# Patient Record
Sex: Female | Born: 1955 | Race: White | Hispanic: No | Marital: Married | Smoking: Never smoker
Health system: Southern US, Community
[De-identification: ages and names within clinical notes are randomized; demographics above are authoritative.]

## PROBLEM LIST (undated history)

## (undated) DIAGNOSIS — N200 Calculus of kidney: Secondary | ICD-10-CM

## (undated) DIAGNOSIS — N189 Chronic kidney disease, unspecified: Secondary | ICD-10-CM

## (undated) DIAGNOSIS — F0280 Dementia in other diseases classified elsewhere without behavioral disturbance: Secondary | ICD-10-CM

## (undated) DIAGNOSIS — F32A Depression, unspecified: Secondary | ICD-10-CM

## (undated) DIAGNOSIS — Z87442 Personal history of urinary calculi: Secondary | ICD-10-CM

## (undated) DIAGNOSIS — G300 Alzheimer's disease with early onset: Secondary | ICD-10-CM

## (undated) DIAGNOSIS — E039 Hypothyroidism, unspecified: Secondary | ICD-10-CM

## (undated) DIAGNOSIS — K219 Gastro-esophageal reflux disease without esophagitis: Secondary | ICD-10-CM

## (undated) DIAGNOSIS — F329 Major depressive disorder, single episode, unspecified: Secondary | ICD-10-CM

## (undated) DIAGNOSIS — E7800 Pure hypercholesterolemia, unspecified: Secondary | ICD-10-CM

## (undated) HISTORY — PX: CATARACT EXTRACTION: SUR2

## (undated) HISTORY — PX: LITHOTRIPSY: SUR834

## (undated) HISTORY — PX: CERVICAL POLYPECTOMY: SHX88

---

## 2012-01-01 ENCOUNTER — Encounter (HOSPITAL_COMMUNITY): Payer: Self-pay | Admitting: *Deleted

## 2012-01-01 ENCOUNTER — Other Ambulatory Visit: Payer: Self-pay | Admitting: Urology

## 2012-01-01 NOTE — Progress Notes (Signed)
Patient to come to short stay 01/02/12 for EKGTake laxative around 1700 day before procedure Do not wear a belt  Bring blue folder with papers filled out Bring insurance card    Pre Litho instructions given to patient and verbalized understanding:  Take  Laxative as directed No aspirin ,ibubrofen, advil toradol 72 hours prior to lithotripsy review list in blue folder

## 2012-01-02 ENCOUNTER — Ambulatory Visit (HOSPITAL_COMMUNITY)
Admission: RE | Admit: 2012-01-02 | Discharge: 2012-01-02 | Disposition: A | Discharge status: Home / Self Care | Payer: BC Managed Care – PPO | Source: Ambulatory Visit | Attending: Urology | Admitting: Urology

## 2012-01-02 DIAGNOSIS — Z01810 Encounter for preprocedural cardiovascular examination: Secondary | ICD-10-CM | POA: Insufficient documentation

## 2012-01-02 DIAGNOSIS — N201 Calculus of ureter: Secondary | ICD-10-CM | POA: Insufficient documentation

## 2012-01-06 ENCOUNTER — Encounter (HOSPITAL_COMMUNITY): Payer: Self-pay | Admitting: *Deleted

## 2012-01-06 ENCOUNTER — Ambulatory Visit (HOSPITAL_COMMUNITY)
Admission: RE | Admit: 2012-01-06 | Discharge: 2012-01-06 | Disposition: A | Discharge status: Home / Self Care | Payer: BC Managed Care – PPO | Source: Ambulatory Visit | Attending: Urology | Admitting: Urology

## 2012-01-06 ENCOUNTER — Encounter (HOSPITAL_COMMUNITY)
Admission: RE | Disposition: A | Discharge status: Home / Self Care | Payer: Self-pay | Source: Ambulatory Visit | Attending: Urology

## 2012-01-06 ENCOUNTER — Ambulatory Visit (HOSPITAL_COMMUNITY): Payer: BC Managed Care – PPO

## 2012-01-06 DIAGNOSIS — K219 Gastro-esophageal reflux disease without esophagitis: Secondary | ICD-10-CM | POA: Insufficient documentation

## 2012-01-06 DIAGNOSIS — N201 Calculus of ureter: Secondary | ICD-10-CM | POA: Insufficient documentation

## 2012-01-06 DIAGNOSIS — E039 Hypothyroidism, unspecified: Secondary | ICD-10-CM | POA: Insufficient documentation

## 2012-01-06 DIAGNOSIS — I129 Hypertensive chronic kidney disease with stage 1 through stage 4 chronic kidney disease, or unspecified chronic kidney disease: Secondary | ICD-10-CM | POA: Insufficient documentation

## 2012-01-06 DIAGNOSIS — Z79899 Other long term (current) drug therapy: Secondary | ICD-10-CM | POA: Insufficient documentation

## 2012-01-06 DIAGNOSIS — N189 Chronic kidney disease, unspecified: Secondary | ICD-10-CM | POA: Insufficient documentation

## 2012-01-06 HISTORY — DX: Chronic kidney disease, unspecified: N18.9

## 2012-01-06 HISTORY — DX: Depression, unspecified: F32.A

## 2012-01-06 HISTORY — DX: Major depressive disorder, single episode, unspecified: F32.9

## 2012-01-06 HISTORY — DX: Hypothyroidism, unspecified: E03.9

## 2012-01-06 HISTORY — DX: Calculus of kidney: N20.0

## 2012-01-06 HISTORY — DX: Gastro-esophageal reflux disease without esophagitis: K21.9

## 2012-01-06 SURGERY — LITHOTRIPSY, ESWL
Anesthesia: LOCAL | Laterality: Right

## 2012-01-06 MED ORDER — DIAZEPAM 5 MG PO TABS
10.0000 mg | ORAL_TABLET | ORAL | Status: AC
  Administered 2012-01-06: 10 mg via ORAL

## 2012-01-06 MED ORDER — CIPROFLOXACIN IN D5W 400 MG/200ML IV SOLN
400.0000 mg | INTRAVENOUS | Status: AC
  Administered 2012-01-06: 400 mg via INTRAVENOUS

## 2012-01-06 MED ORDER — DIPHENHYDRAMINE HCL 25 MG PO CAPS
25.0000 mg | ORAL_CAPSULE | ORAL | Status: AC
  Administered 2012-01-06: 25 mg via ORAL

## 2012-01-06 MED ORDER — DEXTROSE-NACL 5-0.45 % IV SOLN
INTRAVENOUS | Status: DC
  Administered 2012-01-06: 1000 mL via INTRAVENOUS

## 2012-01-06 NOTE — Progress Notes (Signed)
IV dc'd from right hand per protocol, cath intact and site unremarkable.

## 2012-01-06 NOTE — H&P (Signed)
H&P  Chief Complaint: kidney stone  History of Present Illness: Margaret Johnston is a 56 y.o. year old female who presents now for lithotripsy of a 6 mm right ureteropelvic junction stone.she originally presented to our office on 17 April with flank pain, nausea and vomiting. She had an evaluation which revealed a 6 mm proximal ureteral stone. A week later, there had been no progression on followup. She presents at this time for definitive management i.e. Lithotripsy.  Past Medical History  Diagnosis Date  . Angina     has had chest pain at times. Last time approx 3 mo ago  . Hypothyroidism   . Depression   . Chronic kidney disease     right ureteral stone  . GERD (gastroesophageal reflux disease)     rarely takes Prilosec  . Renal stones     Past Surgical History  Procedure Date  . Cervical polypectomy     Home Medications:  Medications Prior to Admission  Medication Sig Dispense Refill  . calcium carbonate (OS-CAL) 600 MG TABS Take 600 mg by mouth daily.      . fenofibrate 160 MG tablet Take 160 mg by mouth daily.      Marland Kitchen levothyroxine (SYNTHROID, LEVOTHROID) 100 MCG tablet Take 100 mcg by mouth daily.      . mometasone (NASONEX) 50 MCG/ACT nasal spray Place 2 sprays into the nose daily.      . silodosin (RAPAFLO) 8 MG CAPS capsule Take 8 mg by mouth daily with breakfast.        Allergies:  Allergies  Allergen Reactions  . Oxycodone Itching    History reviewed. No pertinent family history.  Social History:  reports that she has never smoked. She does not have any smokeless tobacco history on file. She reports that she drinks alcohol. She reports that she does not use illicit drugs. Review of Systems Genitourinary, constitutional, skin, eye, otolaryngeal, hematologic/lymphatic, cardiovascular, pulmonary, endocrine, musculoskeletal, gastrointestinal, neurological and psychiatric system(s) were reviewed and pertinent findings if present are noted.  Gastrointestinal: flank  pain and abdominal pain.     Physical Exam:  Vital signs in last 24 hours: Temp:  [98.5 F (36.9 C)] 98.5 F (36.9 C) (04/29 1539) Pulse Rate:  [65] 65  (04/29 1539) Resp:  [16] 16  (04/29 1539) BP: (118)/(78) 118/78 mmHg (04/29 1539) SpO2:  [100 %] 100 % (04/29 1539) Weight:  [69.911 kg (154 lb 2 oz)] 69.911 kg (154 lb 2 oz) (04/29 1539) General:  Alert and oriented, No acute distress HEENT: Normocephalic, atraumatic Neck: No JVD or lymphadenopathy Cardiovascular: Regular rate and rhythm Lungs: Clear bilaterally Abdomen: Soft, nontender, nondistended, no abdominal masses Back: No CVA tenderness Extremities: No edema Neurologic: Grossly intact  Laboratory Data:  No results found for this or any previous visit (from the past 24 hour(s)). No results found for this or any previous visit (from the past 240 hour(s)). Creatinine: No results found for this basename: CREATININE:7 in the last 168 hours  Radiologic Imaging: Dg Abd 1 View  01/06/2012  *RADIOLOGY REPORT*  Clinical Data: Lithotripsy  ABDOMEN - 1 VIEW  Comparison: 12/31/2011  Findings: 6 mm calculus projects over  the right ureteropelvic junction.  5 mm calculus projects over the upper pole of the right kidney.  Vascular calcifications are noted.  Left pelvic phleboliths.  No disproportionate dilatation of bowel.  IMPRESSION: Right nephrolithiasis.  6 mm right ureteral pelvic junction calculus.  Nonobstructive bowel gas pattern.  Original Report Authenticated By: Marlowe Aschoff  HOSS, M.D.    Impression/Assessment:  6 mm right proximal ureteral stone  Plan:  lithotripsy  Chelsea Aus 01/06/2012, 5:26 PM  Bertram Millard. Lizanne Erker MD

## 2012-01-06 NOTE — Discharge Instructions (Signed)
See [facility] discharge instructions in chart.  

## 2012-04-07 ENCOUNTER — Other Ambulatory Visit: Payer: Self-pay | Admitting: Urology

## 2012-04-13 ENCOUNTER — Encounter (HOSPITAL_COMMUNITY): Payer: Self-pay | Admitting: *Deleted

## 2012-04-13 NOTE — H&P (Signed)
  H&P  Chief Complaint:kidneys  History of Present Illness: Margaret Johnston is a 56 y.o. year old female who has a prior history of urolithiasis, and recently presented with 29th of July with right flank pain. CT urogram revealed a 6-7 mm right upper/mid ureteral calculus. Because of her history of non-passage of stones, she is scheduled for lithotripsy.  Past Medical History  Diagnosis Date  . Angina     has had chest pain at times. Last time approx 3 mo ago  . Hypothyroidism   . Depression   . Chronic kidney disease     right ureteral stone  . GERD (gastroesophageal reflux disease)     rarely takes Prilosec  . Renal stones     Past Surgical History  Procedure Date  . Cervical polypectomy     Home Medications:  No prescriptions prior to admission    Allergies:  Allergies  Allergen Reactions  . Oxycodone Itching    No family history on file.  Social History:  reports that she has never smoked. She does not have any smokeless tobacco history on file. She reports that she drinks alcohol. She reports that she does not use illicit drugs.  ROS: A complete review of systems was performed.  All systems are negative except for pertinent findings as noted.  Physical Exam:  Vital signs in last 24 hours:   General:  Alert and oriented, No acute distress HEENT: Normocephalic, atraumatic Neck: No JVD or lymphadenopathy Cardiovascular: Regular rate and rhythm Lungs: Clear bilaterally Abdomen: Soft, nontender, nondistended, no abdominal masses Back: No CVA tenderness Extremities: No edema Neurologic: Grossly intact  Laboratory Data:  No results found for this or any previous visit (from the past 24 hour(s)). No results found for this or any previous visit (from the past 240 hour(s)). Creatinine: No results found for this basename: CREATININE:7 in the last 168 hours  Radiologic Imaging: No results found.  Impression/Assessment:  7 mm right upper ureteral stone  Plan:   Lithotripsy of right ureteral stone  Chelsea Aus 04/13/2012, 9:56 AM  Bertram Millard. Clare Fennimore MD

## 2012-04-13 NOTE — Progress Notes (Signed)
1255 Asked to bring blue folder, take laxative and light dinner 04-15-12 by 5 pm and no later than 6 pm Bring blue folder,insurance and ID, Asked not to take any Aspirin,Ibuprofen or Toradol 72 hours prior to procedure. Patient was able to repeat all pre op instructions back without difficiuilty for her lithotripsy procedure.

## 2012-04-15 ENCOUNTER — Encounter (HOSPITAL_COMMUNITY): Payer: Self-pay | Admitting: Pharmacy Technician

## 2012-04-16 ENCOUNTER — Encounter (HOSPITAL_COMMUNITY): Admission: RE | Payer: Self-pay | Source: Ambulatory Visit

## 2012-04-16 ENCOUNTER — Ambulatory Visit (HOSPITAL_COMMUNITY): Admission: RE | Admit: 2012-04-16 | Payer: BC Managed Care – PPO | Source: Ambulatory Visit | Admitting: Urology

## 2012-04-16 SURGERY — LITHOTRIPSY, ESWL
Anesthesia: LOCAL | Laterality: Right

## 2012-07-31 ENCOUNTER — Other Ambulatory Visit: Payer: Self-pay | Admitting: Urology

## 2012-08-10 ENCOUNTER — Encounter (HOSPITAL_COMMUNITY): Payer: Self-pay | Admitting: *Deleted

## 2012-08-10 ENCOUNTER — Encounter (HOSPITAL_COMMUNITY): Payer: Self-pay | Admitting: Pharmacy Technician

## 2012-08-10 NOTE — Pre-Procedure Instructions (Addendum)
Asked to bring blue folder the day of the procedure,insurance card,I.D. driver's license,wear comfortable clothing and have a driver for the day. Asked not to take Advil,Motrin,Ibuprofen,Aleve or any NSAIDS, Aspirin, or Toradol for 72 hours prior to procedure,  No vitamins or herbal medications 7 days prior to procedure.  Stopping vitamin and calium. Instructed to take laxative per doctor's office instructions and eat a light dinner the evening before procedure.   To arrive at 0645 for lithotripsy procedure.  History of angina denies having chest discomfort or pain.

## 2012-08-24 ENCOUNTER — Encounter (HOSPITAL_COMMUNITY): Payer: Self-pay | Admitting: *Deleted

## 2012-08-24 ENCOUNTER — Encounter (HOSPITAL_COMMUNITY)
Admission: RE | Disposition: A | Discharge status: Home / Self Care | Payer: Self-pay | Source: Ambulatory Visit | Attending: Urology

## 2012-08-24 ENCOUNTER — Ambulatory Visit (HOSPITAL_COMMUNITY): Payer: BC Managed Care – PPO

## 2012-08-24 ENCOUNTER — Ambulatory Visit (HOSPITAL_COMMUNITY)
Admission: RE | Admit: 2012-08-24 | Discharge: 2012-08-24 | Disposition: A | Discharge status: Home / Self Care | Payer: BC Managed Care – PPO | Source: Ambulatory Visit | Attending: Urology | Admitting: Urology

## 2012-08-24 DIAGNOSIS — N201 Calculus of ureter: Secondary | ICD-10-CM

## 2012-08-24 DIAGNOSIS — K219 Gastro-esophageal reflux disease without esophagitis: Secondary | ICD-10-CM | POA: Insufficient documentation

## 2012-08-24 DIAGNOSIS — Z79899 Other long term (current) drug therapy: Secondary | ICD-10-CM | POA: Insufficient documentation

## 2012-08-24 DIAGNOSIS — E7800 Pure hypercholesterolemia, unspecified: Secondary | ICD-10-CM | POA: Insufficient documentation

## 2012-08-24 DIAGNOSIS — E039 Hypothyroidism, unspecified: Secondary | ICD-10-CM | POA: Insufficient documentation

## 2012-08-24 HISTORY — DX: Pure hypercholesterolemia, unspecified: E78.00

## 2012-08-24 SURGERY — LITHOTRIPSY, ESWL
Anesthesia: LOCAL | Laterality: Right

## 2012-08-24 MED ORDER — LEVOFLOXACIN 500 MG PO TABS
500.0000 mg | ORAL_TABLET | ORAL | Status: AC
  Administered 2012-08-24: 500 mg via ORAL
  Filled 2012-08-24: qty 1

## 2012-08-24 MED ORDER — DEXTROSE-NACL 5-0.45 % IV SOLN
INTRAVENOUS | Status: DC
  Administered 2012-08-24: 08:00:00 via INTRAVENOUS

## 2012-08-24 MED ORDER — KETOROLAC TROMETHAMINE 30 MG/ML IJ SOLN
30.0000 mg | Freq: Once | INTRAMUSCULAR | Status: AC
  Administered 2012-08-24: 30 mg via INTRAVENOUS
  Filled 2012-08-24: qty 1

## 2012-08-24 MED ORDER — DIPHENHYDRAMINE HCL 25 MG PO CAPS
25.0000 mg | ORAL_CAPSULE | ORAL | Status: AC
  Administered 2012-08-24: 25 mg via ORAL
  Filled 2012-08-24: qty 1

## 2012-08-24 MED ORDER — DIAZEPAM 5 MG PO TABS
10.0000 mg | ORAL_TABLET | ORAL | Status: AC
  Administered 2012-08-24: 10 mg via ORAL
  Filled 2012-08-24: qty 2

## 2012-08-24 NOTE — H&P (Signed)
  H&P  Chief Complaint: Right sided kidney stone  History of Present Illness: Margaret Johnston is a 56 y.o. year old who presents for lithotripsy of a symptomatic 6 mm right proximal/mid ureteral stone.   Past Medical History  Diagnosis Date  . Angina     has had chest pain at times. Last time approx 3 mo ago  . Hypothyroidism   . Chronic kidney disease     right ureteral stone  . GERD (gastroesophageal reflux disease)     rarely takes Prilosec  . Renal stones   . Depression   . Hypercholesterolemia     Past Surgical History  Procedure Date  . Cervical polypectomy     Home Medications:  No prescriptions prior to admission    Allergies:  Allergies  Allergen Reactions  . Hydromorphone Itching  . Oxycodone Itching    History reviewed. No pertinent family history.  Social History:  reports that she has never smoked. She has never used smokeless tobacco. She reports that she drinks alcohol. She reports that she does not use illicit drugs.  ROS: A complete review of systems was performed.  All systems are negative except for pertinent findings as noted.  Physical Exam:  Vital signs in last 24 hours:   General:  Alert and oriented, No acute distress HEENT: Normocephalic, atraumatic Neck: No JVD or lymphadenopathy Cardiovascular: Regular rate and rhythm Lungs: Clear bilaterally Abdomen: Soft, nontender, nondistended, no abdominal masses Back: No CVA tenderness Extremities: No edema Neurologic: Grossly intact  Laboratory Data:  No results found for this or any previous visit (from the past 24 hour(s)). No results found for this or any previous visit (from the past 240 hour(s)). Creatinine: No results found for this basename: CREATININE:7 in the last 168 hours  Radiologic Imaging: No results found.  Impression/Assessment:  6 mm right proximal ureteral ston  Plan:  ESL of right ureteral stone  Chelsea Aus 08/24/2012, 6:03 AM  Bertram Millard. Quamel Fitzmaurice  MD

## 2012-08-24 NOTE — Interval H&P Note (Signed)
History and Physical Interval Note:  08/24/2012 8:48 AM  [name]  has presented today for surgery, with the diagnosis of RIGHT URETERAL STONE  The various methods of treatment have been discussed with the patient and family. After consideration of risks, benefits and other options for treatment, the patient has consented to  Procedure(s) (LRB) with comments: EXTRACORPOREAL SHOCK WAVE LITHOTRIPSY (ESWL) (Right) as a surgical intervention .  The patient's history has been reviewed, patient examined, no change in status, stable for surgery.  I have reviewed the patient's chart and labs.  Questions were answered to the patient's satisfaction.     Margaret Johnston

## 2012-08-24 NOTE — Progress Notes (Signed)
Patient feels much better after Toradol. Has voided x2. Home with husband.

## 2012-08-24 NOTE — Progress Notes (Signed)
Patient has been back from [facility] for almost two hours. Waking up now and has been to the bathroom and voided. Stated her pain is 4-5/10 in her right lower flank. Paged MD for pain med order. Patient stated she has pain med script at home.

## 2012-08-24 NOTE — Progress Notes (Signed)
Patient c/o a little nausea. Instructed to slow down on po fluids. Patient is so sleepy. Do not want to give her further meds , if can keep her comfortable w/o.

## 2012-08-24 NOTE — Progress Notes (Signed)
Spoke to Dr. Retta Diones and he is entering order for pain med now.

## 2015-01-18 ENCOUNTER — Other Ambulatory Visit: Payer: Self-pay | Admitting: Family Medicine

## 2015-01-18 DIAGNOSIS — R110 Nausea: Secondary | ICD-10-CM

## 2015-01-18 DIAGNOSIS — R1011 Right upper quadrant pain: Secondary | ICD-10-CM

## 2015-02-01 ENCOUNTER — Ambulatory Visit
Admission: RE | Admit: 2015-02-01 | Discharge: 2015-02-01 | Disposition: A | Discharge status: Home / Self Care | Payer: Self-pay | Source: Ambulatory Visit | Attending: Family Medicine | Admitting: Family Medicine

## 2015-02-01 DIAGNOSIS — R1011 Right upper quadrant pain: Secondary | ICD-10-CM

## 2015-02-01 DIAGNOSIS — R110 Nausea: Secondary | ICD-10-CM

## 2015-11-10 ENCOUNTER — Other Ambulatory Visit: Payer: Self-pay | Admitting: Family Medicine

## 2015-11-10 DIAGNOSIS — R413 Other amnesia: Secondary | ICD-10-CM

## 2015-11-16 ENCOUNTER — Ambulatory Visit
Admission: RE | Admit: 2015-11-16 | Discharge: 2015-11-16 | Disposition: A | Discharge status: Home / Self Care | Payer: BLUE CROSS/BLUE SHIELD | Source: Ambulatory Visit | Attending: Family Medicine | Admitting: Family Medicine

## 2015-11-16 DIAGNOSIS — R413 Other amnesia: Secondary | ICD-10-CM

## 2015-12-07 ENCOUNTER — Ambulatory Visit (INDEPENDENT_AMBULATORY_CARE_PROVIDER_SITE_OTHER): Payer: BLUE CROSS/BLUE SHIELD | Admitting: Neurology

## 2015-12-07 ENCOUNTER — Encounter: Payer: Self-pay | Admitting: Neurology

## 2015-12-07 ENCOUNTER — Other Ambulatory Visit: Payer: BLUE CROSS/BLUE SHIELD

## 2015-12-07 VITALS — BP 100/70 | HR 75 | Ht 66.0 in | Wt 153.2 lb

## 2015-12-07 DIAGNOSIS — H02401 Unspecified ptosis of right eyelid: Secondary | ICD-10-CM | POA: Diagnosis not present

## 2015-12-07 DIAGNOSIS — G3184 Mild cognitive impairment, so stated: Secondary | ICD-10-CM

## 2015-12-07 LAB — VITAMIN B12: Vitamin B-12: 510 pg/mL (ref 211–911)

## 2015-12-07 NOTE — Progress Notes (Signed)
[facility] Clinic Note - Initial Visit   Date: 12/07/2015  Marja Adderley MRN: [id number] DOB: Jun 15, 1956   Dear Dr. Duanne Guess:  Thank you for your kind referral of Raneen Jaffer for consultation of memory changes. Although her history is well known to you, please allow [place] to reiterate it for the purpose of our medical record. The patient was accompanied to the clinic by husband who also provides collateral information.     History of Present Illness: Saory Carriero is a 60 y.o. right-handed Caucasian female with hypothyroid, GERD, and history of kidney stones presenting for evaluation of memory changes.    Her husband retired in August 2015 because of newly diagnosed neuroendocrine pancreatic cancer.  Around this time, she began noticing that that she could not always remember the date.  She had been managing finances for a number of years but in 2016, she delegated the responsibilities to her husband because of difficulty with numbers.  She would often make errors such as saying "100" instead of "1000".  She has restricted her driving to local distances only.  She has not been involved in any MVAs.  She has interests such as sewing and crochet and volunteers for the church.  Sleep is good.  Mood is good. Her husband states that there is some anxiety because of his medical problems since he is being monitored every 3 months for recurrence. She does all the cleaning and cooking at home. She has seven grandchildren, all in [place], except for one in [place] so they travel often.   Dementia questions Memory Are you repeating things excessively?  No Are you forgetting important details of conversations/events? No Are you prone to misplacing items more now than in the past? Yes, she may walk out of a restaurant without her purse.  Today, in our lobby, she left her purse.  Can you tell me about some recent headlines?  Garnet Koyanagi    Executive Are you having trouble  managing financial matters?  Some  Are you taking your medications regularly w/o prompting? Yes Can you organize and prepare a large holiday meal for multiple people?  I think I could do it Can you multitask effectively?  I don't know, husband states that she can  Language Do you have any word finding difficulties?  Sometimes Do you have trouble following instructions or a conversation? If written Have you been avoiding reading or writing due to problems with recognition or remembering words?  No  Are you using generalities when speaking because of memory trouble? No  Visuospatial Are you getting lost while driving? No  Are you getting turned around in your own home?  No Do you have trouble recognizing familiar faces/family members/close friends? No Do you have trouble dressing, putting on cloths? No     Out-side paper records, electronic medical record, and images have been reviewed where available and summarized as:  CT head 11/16/2015:  Normal   Past Medical History  Diagnosis Date  . Angina     has had chest pain at times. Last time approx 3 mo ago  . Hypothyroidism   . Chronic kidney disease     right ureteral stone  . GERD (gastroesophageal reflux disease)     rarely takes Prilosec  . Renal stones   . Depression   . Hypercholesterolemia     Past Surgical History  Procedure Laterality Date  . Cervical polypectomy       Medications:  Outpatient Encounter Prescriptions  as of 12/07/2015  Medication Sig Note  . calcium carbonate (OS-CAL) 600 MG TABS Take 600 mg by mouth daily.   . cetirizine (ZYRTEC) 10 MG tablet Take 10 mg by mouth daily as needed. allergies   . Fenofibrate 40 MG TABS Take by mouth.   Marland Kitchen ibuprofen (ADVIL,MOTRIN) 800 MG tablet  12/07/2015: Received from: External Pharmacy  . levothyroxine (SYNTHROID, LEVOTHROID) 100 MCG tablet Take 100 mcg by mouth daily before breakfast.    . Multiple Vitamin (MULTIVITAMIN WITH MINERALS) TABS Take 1 tablet by mouth  daily.   Marland Kitchen omeprazole (PRILOSEC) 40 MG capsule  12/07/2015: Received from: External Pharmacy  . Tamsulosin HCl (FLOMAX) 0.4 MG CAPS Take 0.4 mg by mouth daily.   . Vitamin D, Ergocalciferol, (DRISDOL) 50000 units CAPS capsule  12/07/2015: Received from: External Pharmacy  . [DISCONTINUED] atorvastatin (LIPITOR) 40 MG tablet Take 40 mg by mouth every evening.    No facility-administered encounter medications on file as of 12/07/2015.     Allergies:  Allergies  Allergen Reactions  . Hydromorphone Itching  . Oxycodone Itching    Family History: Family History  Problem Relation Age of Onset  . Heart disease Father   . Kidney disease Father   . Cancer Maternal Grandmother   . Alzheimer's disease Paternal Grandmother     Social History: Social History  Substance Use Topics  . Smoking status: Never Smoker   . Smokeless tobacco: Never Used  . Alcohol Use: 0.0 oz/week    0 Standard drinks or equivalent per week     Comment: rarely   Social History   Social History Narrative   Lives with husband in a 2 story home.  Retired Runner, broadcasting/film/video.  Has 3 children.  Education: college.    Review of Systems:  CONSTITUTIONAL: No fevers, chills, night sweats, or weight loss.   EYES: No visual changes or eye pain ENT: No hearing changes.  No history of nose bleeds.   RESPIRATORY: No cough, wheezing and shortness of breath.   CARDIOVASCULAR: Negative for chest pain, and palpitations.   GI: Negative for abdominal discomfort, blood in stools or black stools.  No recent change in bowel habits.   GU:  No history of incontinence.   MUSCLOSKELETAL: No history of joint pain or swelling.  No myalgias.   SKIN: Negative for lesions, rash, and itching.   HEMATOLOGY/ONCOLOGY: Negative for prolonged bleeding, bruising easily, and swollen nodes.  No history of cancer.   ENDOCRINE: Negative for cold or heat intolerance, polydipsia or goiter.   PSYCH:  No depression or anxiety symptoms.   NEURO: As  Above.   Vital Signs:  BP 100/70 mmHg  Pulse 75  Ht  (1.676 m)  Wt 153 lb 3 oz (69.485 kg)  BMI 24.74 kg/m2  SpO2 98% Pain Scale: 0 on a scale of 0-10   General Medical Exam:   General:  Blunted affect, comfortable.   Eyes/ENT: see cranial nerve examination.   Neck: No masses appreciated.  Full range of motion without tenderness.  No carotid bruits. Respiratory:  Clear to auscultation, good air entry bilaterally.   Cardiac:  Regular rate and rhythm, no murmur.   Extremities:  No deformities, edema, or skin discoloration.  Skin:  No rashes or lesions.  Neurological Exam: MENTAL STATUS including orientation to time, place, person is intact.  Attention and concentration is slightly impaired.  Speech is not dysarthric.  Montreal Cognitive Assessment  12/07/2015  Visuospatial/ Executive (0/5) 3  Naming (0/3) 3  Attention:  Read list of digits (0/2) 1  Attention: Read list of letters (0/1) 1  Attention: Serial 7 subtraction starting at 100 (0/3) 2  Language: Repeat phrase (0/2) 2  Language : Fluency (0/1) 1  Abstraction (0/2) 2  Delayed Recall (0/5) 0  Orientation (0/6) 5  Total 20  Adjusted Score (based on education) 20   Luria test positive bilaterally No evidence of apraxia  CRANIAL NERVES: II:  No visual field defects.  Unremarkable fundi.   III-IV-VI: Pupils equal round and reactive to light.  Normal conjugate, extra-ocular eye movements in all directions of gaze.  No nystagmus.  Right ptosis (old).   V:  Normal facial sensation.  Jaw jerk is absent.   VII:  Normal facial symmetry and movements.  No snout or palmomental reflexes.  Myerson's sign is positive.   VIII:  Normal hearing and vestibular function.   IX-X:  Normal palatal movement.   XI:  Normal shoulder shrug and head rotation.   XII:  Normal tongue strength and range of motion, no deviation or fasciculation.  MOTOR:  No atrophy, fasciculations or abnormal movements.  No pronator drift.  Tone is normal.     Right Upper Extremity:    Left Upper Extremity:    Deltoid  5/5   Deltoid  5/5   Biceps  5/5   Biceps  5/5   Triceps  5/5   Triceps  5/5   Wrist extensors  5/5   Wrist extensors  5/5   Wrist flexors  5/5   Wrist flexors  5/5   Finger extensors  5/5   Finger extensors  5/5   Finger flexors  5/5   Finger flexors  5/5   Dorsal interossei  5/5   Dorsal interossei  5/5   Abductor pollicis  5/5   Abductor pollicis  5/5   Tone (Ashworth scale)  0  Tone (Ashworth scale)  0   Right Lower Extremity:    Left Lower Extremity:    Hip flexors  5/5   Hip flexors  5/5   Hip extensors  5/5   Hip extensors  5/5   Knee flexors  5/5   Knee flexors  5/5   Knee extensors  5/5   Knee extensors  5/5   Dorsiflexors  5/5   Dorsiflexors  5/5   Plantarflexors  5/5   Plantarflexors  5/5   Toe extensors  5/5   Toe extensors  5/5   Toe flexors  5/5   Toe flexors  5/5   Tone (Ashworth scale)  0  Tone (Ashworth scale)  0   MSRs:  Right                                                                 Left brachioradialis 2+  brachioradialis 2+  biceps 2+  biceps 2+  triceps 2+  triceps 2+  patellar 2+  patellar 2+  ankle jerk 2+  ankle jerk 2+  Hoffman no  Hoffman no  plantar response down  plantar response down   SENSORY:  Normal and symmetric perception of light touch, pinprick, vibration, and proprioception.  Romberg's sign absent.   COORDINATION/GAIT: Normal finger-to- nose-finger and heel-to-shin.  Intact rapid alternating movements bilaterally.  Able to rise from a chair  without using arms.  Gait narrow based and stable. Tandem and stressed gait intact.    IMPRESSION: Mrs. Heberlein is a 60 year-old female referred for evaluation of short-term memory loss.  Her cognitive testing shows deficits involving visuospatial, attention, and recall.  The remainder of her neurological exam shows right ptosis and otherwise is normal.  To better characterize the nature of her cognitive dysfunction, MRI brain and  neuropsychological testing will be ordered.  We discussed possible diagnosis of mood disorder vs. early onset dementia syndrome.    PLAN/RECOMMENDATIONS:  1.  MRI brain without contrast 2.  Check vitamin B12, vitamin E 3.  Formal neuropsychiatry testing 4.  Encouraged to start exercise program, engage in mentally stimulating activities such as puzzles, crosswords  Return to clinic in 3 months.   The duration of this appointment visit was 60 minutes of face-to-face time with the patient.  Greater than 50% of this time was spent in counseling, explanation of diagnosis, planning of further management, and coordination of care.   Thank you for allowing me to participate in patient's care.  If I can answer any additional questions, I would be pleased to do so.    Sincerely,    Joice Nazario K. Allena Katz, DO

## 2015-12-07 NOTE — Patient Instructions (Addendum)
1.  MRI brain without contrast 2.  Check blood work 3.  Formal neuropsychiatry testing 4.  Encouraged to start exercise program, engage in mentally stimulating activities such as puzzles, crosswords 4.  Return to clinic after the above testing

## 2015-12-08 NOTE — Progress Notes (Signed)
Note routed

## 2015-12-12 ENCOUNTER — Ambulatory Visit
Admission: RE | Admit: 2015-12-12 | Discharge: 2015-12-12 | Disposition: A | Discharge status: Home / Self Care | Payer: BLUE CROSS/BLUE SHIELD | Source: Ambulatory Visit | Attending: Urology | Admitting: Urology

## 2015-12-12 DIAGNOSIS — R410 Disorientation, unspecified: Secondary | ICD-10-CM | POA: Diagnosis not present

## 2015-12-12 DIAGNOSIS — H02401 Unspecified ptosis of right eyelid: Secondary | ICD-10-CM

## 2015-12-12 DIAGNOSIS — G3184 Mild cognitive impairment, so stated: Secondary | ICD-10-CM

## 2015-12-12 LAB — VITAMIN E: VITAMIN E (ALPHA TOCOPHEROL): 16.7 mg/L (ref 6.5–21.5)

## 2015-12-15 ENCOUNTER — Telehealth: Payer: Self-pay | Admitting: Neurology

## 2015-12-15 NOTE — Telephone Encounter (Signed)
Copy mailed to patient and referral sent.

## 2015-12-15 NOTE — Telephone Encounter (Signed)
Called patient with results of MRI brian which shows mild global atrophy, worse over the parietal lobes.  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  She endorses right ear and jaw pain.  To better characterize the nature of her parotid abnormality, I will send her to ENT for evaluation.  Margaret Fomby K. [name], DO

## 2015-12-20 ENCOUNTER — Encounter: Payer: Self-pay | Admitting: Neurology

## 2015-12-26 DIAGNOSIS — E039 Hypothyroidism, unspecified: Secondary | ICD-10-CM | POA: Diagnosis not present

## 2015-12-28 DIAGNOSIS — J309 Allergic rhinitis, unspecified: Secondary | ICD-10-CM | POA: Diagnosis not present

## 2015-12-28 DIAGNOSIS — R413 Other amnesia: Secondary | ICD-10-CM | POA: Diagnosis not present

## 2015-12-28 DIAGNOSIS — Z6825 Body mass index (BMI) 25.0-25.9, adult: Secondary | ICD-10-CM | POA: Diagnosis not present

## 2015-12-28 DIAGNOSIS — E785 Hyperlipidemia, unspecified: Secondary | ICD-10-CM | POA: Diagnosis not present

## 2015-12-29 ENCOUNTER — Telehealth: Payer: Self-pay | Admitting: *Deleted

## 2015-12-29 NOTE — Telephone Encounter (Signed)
[facility] with Dr. Jearld FentonByers.

## 2016-01-10 DIAGNOSIS — D110 Benign neoplasm of parotid gland: Secondary | ICD-10-CM | POA: Diagnosis not present

## 2016-01-23 ENCOUNTER — Ambulatory Visit (INDEPENDENT_AMBULATORY_CARE_PROVIDER_SITE_OTHER): Payer: BLUE CROSS/BLUE SHIELD | Admitting: Psychology

## 2016-01-23 DIAGNOSIS — R4189 Other symptoms and signs involving cognitive functions and awareness: Secondary | ICD-10-CM | POA: Diagnosis not present

## 2016-01-23 DIAGNOSIS — R413 Other amnesia: Secondary | ICD-10-CM | POA: Diagnosis not present

## 2016-01-24 ENCOUNTER — Encounter: Payer: Self-pay | Admitting: Psychology

## 2016-01-24 NOTE — Progress Notes (Signed)
NEUROPSYCHOLOGICAL INTERVIEW (CPT: [id number])  Name: Margaret Johnston Date of Birth: 06-24-1956 Date of Interview: 01/24/2016  Reason for Referral:  Margaret Johnston is a 60 y.o., right-handed, married female who is referred for neuropsychological evaluation by Dr. Nita Sickle of [facility] due to concerns about memory changes. This patient was unaccompanied to the appointment today.  History of Presenting Problem:  Margaret Johnston reported first noticing mild cognitive changes in 2015. She reported that she initially noticed having trouble with numbers. She noticed more difficulty in filling out deposit slips and in writing or saying numbers. In July 2015, the patient's husband was diagnosed with neuroendocrine pancreatic cancer. He retired in August 2015. She reported that she noticed the aforementioned cognitive changes prior to his diagnosis and retirement. In 2016, she delegated the finances to her husband because of the difficulties she was having. She also reported that over time she has become much less comfortable driving. She reported having some confusion with stoplights and lanes. She reported it is difficult to explain the exact problems she is having with this. Other cognitive changes reported by the patient include frequently misplacing belongings such as her glasses and her purse. Since seeing Dr. Allena Katz in March, the patient has been wearing a cross body satchel type purse so that she does not lose it. Of note, she wore the purse on her person throughout the entire three-hour appointment today. The patient also reported that she has trouble remembering the day and date, and has to ask her husband for this information frequently. Also, the patient described one occasion somewhat recently when she was paying the bills at a restaurant and became very overwhelmed and had difficulty processing what to do. She reported that has only happened on one occasion. Overall, the patient reported only mild  concern about her cognitive changes. She noted that if she had not been asked directly about changes in memory at her routine appointment, she would not have brought it up to anyone.  Margaret Johnston was seen for neurologic consultation by Dr. Allena Katz on 12/07/2015. Her score on the MOCA was a 20. Neurologic exam was negative aside from right ptosis. An MRI of the brain was ordered. According to the report dated 12/12/2015, there was mild global atrophy most notable in the parietal lobes.  The patient denied any significant physical concerns or physical pain at the current time. She did note intermittent burning sensation in her feet, which she has mentioned to her primary care physician.  When asked about her mood, Margaret Johnston reported that it was "average, not terrible, not terrific". She denied difficulties with sleep or appetite. She noted that her husband's terminal illness is probably always in the back of her mind, but that she is not sad all the time as a result of this. She and her husband continue to have good times together. Unfortunately, she reported that he did recently undergo some testing which showed increased problems with his lungs, which were affected by a previous bout of cancer.  Margaret Johnston also reported chronic psychosocial stress related to her relationship with her mother who has borderline personality disorder. She has been essentially estranged from her mother for 8-9 years. She reported that Mother's Day, which was 2 days ago, is always difficult for her.  The patient denied any psychiatric history aside from situational depression related to relationship tension with her mother. She noted that after one conflict with her mother several years ago, she did experience some passive suicidal  ideation but no intention or plan. She denied any suicidal ideation or intention since that time. She did see a counselor for about a year in the past to deal with stress surrounding her relationship with her  mother.  Current Functioning: Margaret Johnston lives with her husband in their own home. She is retired. Her husband is continuing to undergo treatment for his stage IV neuroendocrine cancer. She reported that he has been relatively stable for about 2 years. The patient drives locally. As noted previously, she is more uncomfortable with driving recently. She denied any history of getting lost. She denied history of MVA. The patient continues to manage her medications and do housekeeping tasks without any significant reported difficulty. As noted previously she has reduced her involvement in the management of finances and bill paying due to "difficulty with numbers" and difficulty completing forms such as deposit slips.   Social History: Margaret Johnston was born in [place] and moved frequently throughout her childhood due to her father's job. She denied history of learning difficulties. She graduated from high school and completed a bachelor's degree. She worked as an Occupational psychologistLD teacher. Margaret Johnston has been married for 39 years. She and her husband have 3 children and 7 grandchildren. The patient reported minimal/rare alcohol use. She has never been a smoker or tobacco user. She denied history of substance abuse or dependence.   Medical History: Past Medical History  Diagnosis Date  . Angina     has had chest pain at times. Last time approx 3 mo ago  . Hypothyroidism   . Chronic kidney disease     right ureteral stone  . GERD (gastroesophageal reflux disease)     rarely takes Prilosec  . Renal stones   . Depression   . Hypercholesterolemia    The patient denied any history of head injury, concussion or loss of consciousness.  Current Medications:  Outpatient Encounter Prescriptions as of 01/23/2016  Medication Sig  . calcium carbonate (OS-CAL) 600 MG TABS Take 600 mg by mouth daily.  . cetirizine (ZYRTEC) 10 MG tablet Take 10 mg by mouth daily as needed. allergies  . Fenofibrate 40 MG TABS Take by mouth.   Marland Kitchen. ibuprofen (ADVIL,MOTRIN) 800 MG tablet   . levothyroxine (SYNTHROID, LEVOTHROID) 100 MCG tablet Take 100 mcg by mouth daily before breakfast.   . Multiple Vitamin (MULTIVITAMIN WITH MINERALS) TABS Take 1 tablet by mouth daily.  Marland Kitchen. omeprazole (PRILOSEC) 40 MG capsule   . Tamsulosin HCl (FLOMAX) 0.4 MG CAPS Take 0.4 mg by mouth daily.  . Vitamin D, Ergocalciferol, (DRISDOL) 50000 units CAPS capsule    No facility-administered encounter medications on file as of 01/23/2016.     Behavioral Observations:   Appearance: Neatly dressed and groomed  Gait: Ambulated independently, no abnormalities observed Speech: Fluent; slow to respond at times; low volume Thought process: Linear; processing appears slow at times Affect: Blunted Task persistence: Appears upset with her performance at times, stating that she feels she could do better if she was not being observed by the examiner. Did not require any breaks during the testing session. Orientation: Oriented to all spheres  TESTING: There was medical necessity to proceed with neuropsychological assessment as the results will be used to aid in differential diagnosis and clinical decision-making and to inform specific treatment recommendations. Per the patient and medical records reviewed, there has been a change in cognitive functioning and a reasonable suspicion of a cognitive disorder or neurodegenerative process.  Following the clinical interview, the patient  completed 2.5 hours of neuropsychological testing.   Total face to face time spent in clinical interview: 45 minutes (CPT: [phone]) Total face to face time spent administering neuropsychological tests: 150 minutes  PLAN: The patient will be scheduled for a follow-up session with this provider at which time her test performances and my impressions and treatment recommendations will be reviewed in detail.   Full neuropsychological evaluation report to follow.

## 2016-01-30 ENCOUNTER — Ambulatory Visit (INDEPENDENT_AMBULATORY_CARE_PROVIDER_SITE_OTHER): Payer: BLUE CROSS/BLUE SHIELD | Admitting: Psychology

## 2016-01-30 DIAGNOSIS — F0280 Dementia in other diseases classified elsewhere without behavioral disturbance: Secondary | ICD-10-CM

## 2016-01-30 DIAGNOSIS — G319 Degenerative disease of nervous system, unspecified: Secondary | ICD-10-CM

## 2016-01-30 NOTE — Progress Notes (Signed)
NEUROPSYCHOLOGICAL EVALUATION   Name:    Margaret Johnston  Date of Birth:   1956-05-26 Date of Evaluation:  01/24/2016   Date of Feedback:  01/30/2016     Background Information:  Reason for Referral:  Margaret Johnston is a 60 y.o., right-handed, married female referred by Dr. Posey Pronto to assess her current level of cognitive functioning and assist in differential diagnosis. The current evaluation consisted of a review of available medical records, an interview with the patient (alogn with a follow-up interview with her husband at the feedback session), and the completion of a neuropsychological testing battery. Informed consent was obtained.  History of Presenting Problem:  Margaret Johnston reported first noticing mild cognitive changes in or around 2015. She reported that she initially noticed having trouble with numbers. She noticed more difficulty in filling out deposit slips and in writing or saying numbers. She might write 10 or 100 instead of 1000. In July 2015, the patient's husband was diagnosed with neuroendocrine pancreatic cancer. He retired in August 2015. She reported that she noticed the aforementioned cognitive changes prior to his diagnosis and retirement. In 2016, she delegated the finances to her husband because of the difficulties she was having. She also reported that over time she has become much less comfortable driving. She reported having some confusion with stoplights and lanes. She reported that sometimes she does not see cars in her peripheral vision, and when they come up beside her it frightens her. Other cognitive changes reported by the patient include frequently misplacing belongings such as her glasses and her purse. Since seeing Dr. Posey Pronto in March, the patient has been wearing a cross body satchel type purse so that she does not lose it. Of note, she wore the purse on her person throughout the entire three-hour appointment today. The patient also reported that she has trouble  remembering the day and date, and has to ask her husband for this information frequently. Also, the patient described one occasion somewhat recently when she was paying the bills at a restaurant and became very overwhelmed and had difficulty processing what to do.   Upon direct questioning, the patient also reported blurred vision. She has had cataract surgery but continues to feel like her vision is blurred. When asked about walking and balance, she reported that she feels she has to be more cautious when stepping down from curbs, etc. She stumbled and fell twice when in [place] 1 1/2 years ago. This was very atypical for her. She has not had any other falls.   Margaret Johnston was seen for neurologic consultation by Dr. Posey Pronto on 12/07/2015. Her score on the MOCA was a 20. Neurologic exam was otherwise negative, aside from right ptosis. An MRI of the brain was ordered and completed on 12/12/2015. There was mild global atrophy, and more prominent atrophy in the parietal lobes.  The patient denied any physical pain at the current time. She did note intermittent burning sensation in her feet.  When asked about her mood, Margaret Johnston reported that it was "average, not terrible, not terrific". She denied significant difficulties with sleep or appetite. She noted that her husband's terminal illness is probably always in the back of her mind, but that she is not sad all the time as a result of this. She and her husband continue to have good times together. Unfortunately, she reported that he did recently undergo some testing which showed increased problems with his lungs, which were affected by a previous  bout of cancer.  Margaret Johnston also reported chronic psychosocial stress related to her relationship with her mother who has borderline personality disorder. She has been essentially estranged from her mother for 8-9 years. She reported that Mother's Day, which was 2 days ago, is always difficult for her.  The patient also  reported generalized anxiety and frequent worrying about her grandchildren.   The patient denied any other psychiatric history. She noted that after one conflict with her mother several years ago, she did experience some passive suicidal ideation but no intention or plan. She denied any suicidal ideation or intention since that time. She did see a counselor for about a year in the past to deal with stress surrounding her relationship with her mother.  Current Functioning: Margaret Johnston lives with her husband in their own home. She is retired. Her husband is continuing to undergo treatment for his stage IV neuroendocrine cancer. She reported that he has been relatively stable for about 2 years. The patient drives locally but not very often. As noted previously, she is more uncomfortable with driving recently. She denied any history of getting lost. She denied history of MVA. The patient continues to manage her medications and do housekeeping tasks without any significant reported difficulty. As noted previously she has reduced her involvement in the management of finances and bill paying due to "difficulty with numbers" and difficulty completing forms such as deposit slips.    Medical History:  Past Medical History  Diagnosis Date  . Angina     has had chest pain at times. Last time approx 3 mo ago  . Hypothyroidism   . Chronic kidney disease     right ureteral stone  . GERD (gastroesophageal reflux disease)     rarely takes Prilosec  . Renal stones   . Depression   . Hypercholesterolemia     Current medications:  Outpatient Encounter Prescriptions as of 01/30/2016  Medication Sig  . calcium carbonate (OS-CAL) 600 MG TABS Take 600 mg by mouth daily.  . cetirizine (ZYRTEC) 10 MG tablet Take 10 mg by mouth daily as needed. allergies  . Fenofibrate 40 MG TABS Take by mouth.  Marland Kitchen ibuprofen (ADVIL,MOTRIN) 800 MG tablet   . levothyroxine (SYNTHROID, LEVOTHROID) 100 MCG tablet Take 100 mcg by mouth  daily before breakfast.   . Multiple Vitamin (MULTIVITAMIN WITH MINERALS) TABS Take 1 tablet by mouth daily.  Marland Kitchen omeprazole (PRILOSEC) 40 MG capsule   . Tamsulosin HCl (FLOMAX) 0.4 MG CAPS Take 0.4 mg by mouth daily.  . Vitamin D, Ergocalciferol, (DRISDOL) 50000 units CAPS capsule    No facility-administered encounter medications on file as of 01/30/2016.     Current Examination:  Behavioral Observations:  Appearance: Neatly dressed and groomed  Gait: Ambulated independently, no abnormalities observed Speech: Fluent; slow to respond at times; low volume Thought process: Linear; processing appears slow at times Affect: Blunted Task persistence: Appears upset with her performance at times, stating that she feels she could do better if she was not being observed by the examiner. Did not require any breaks during the testing session. Orientation: Oriented to all spheres  Tests Administered: . Test of Premorbid Functioning (TOPF) . Wechsler Adult Intelligence Scale-Fourth Edition (WAIS-IV): Similarities, Matrix Reasoning, Arithmetic, Symbol Search, Coding and Digit Span subtests . Wechsler Memory Scale-Fourth Edition (WMS-IV): Logical Memory I, II and Recognition subtests . Repeatable Battery for the Assessment of Neuropsychological Status (RBANS) - Form A: Line Orientation, Figure Copy and Figure Recall subtests . Neuropsychological Assessment  Battery (NAB) Language Module, Form 1: Naming Subtest . Controlled Oral Word Association Test (COWAT) . Trail Making Test A and B . Wisconsin Verbal Learning Test - 2nd Edition (CVLT-2) . Clock Drawing Test . Beck Depression Inventory-Second Edition (BDI-II) . Generalized Anxiety Disorder Screener - 7 item (GAD-7)  Test Results: Note: Standardized scores are presented only for use by appropriately trained professionals and to allow for any future test-retest comparison. These scores should not be interpreted without consideration of all the  information that is contained in the rest of the report. The most recent standardization samples from the test publisher or other sources were used whenever possible to derive standard scores; scores were corrected for age, gender, ethnicity and education when available.   Test Scores:  Test Name Standardized Score Descriptor  TOPF SS=119 High average  WAIS-IV Subtests    Block Design ss=6 Low Average  Similarities ss=11 Average  Digit Span ss=6 Low Average  Matrix Reasoning ss=6 Low Average  Arithmetic ss=7 Low Average  Symbol Search ss=1 Severely Impaired  Coding ss=3 Impaired  WAIS-IV Index Scores    Perceptual Reasoning Index SS=77 Borderline Impaired  Working Memory Index SS=80 Low Average  Processing Speed Index SS=56 Severely Impaired  WMS-IV Subtests    Logical Memory I ss=4 Borderline Impaired  Logical Memory II ss=4 Borderline Impaired  Logical Memory Recognition 26-50cum%ile   CVLT-II Scores    Trial 1 Z=-1.0 Low average  Trial 5 Z=-1.5 Borderline Impaired  Trials 1-5 total T=41 Low average  Trial B Z=-0.5 Average  SD Free Recall (raw=5) Z=-1.5 Borderline Impaired  SD Cued Recall (raw=7) Z=-2.0 Impaired  LD Free Recall (raw=8) Z=-1.0 Low Average  LD Cued Recall (raw=6) Z=-2.0 Impaired  Recognition Hits (raw=16/16) Z=0.5 Average  Recognition False Positives (raw=9) Z=-2.0 Impaired  Recognition Discriminability Z=-1.0 Low Average  Forced Choice Recognition  WNL (16/16)  RBANS subtests    Line Orientation Z=-3.31 Severely Impaired  Figure Copy Z=-3.59 Severely Impaired  Figure Recall Z=-2.15 Impaired  RBANS Index Scores    Visualspatial/Constructional Index SS=58 Severely Impaired  NAB Naming T=54 Average  COWAT-FAS T=57 High Average  COWAT-Animals T=38 Low Average  Trail Making Test A T=51 Average  Trail Making Test B Discontinued Severely Impaired  Clock Drawing  Initially drew hands pointing to 11 and 12, then added hand pointing to 2  BDI-II  WNL (2/63)    GAD-7  WNL (2/21)   Summary of Data:   Premorbid verbal intellectual abilities were estimated to have been within the high average to superior range based on a test of word reading. Meanwhile, she demonstrated relatively severe decrements in performance on multiple cognitive tests. Her performances on embedded effort measures were intact, and there was no evidence of poor effort. As such, these performances are likely to reflect true neurocognitive deficits.   The patient demonstrated prominent deficits on all tasks with a complex visual or visual-spatial component. Specifically, psychomotor processing speed on both a coding task and a symbol search task was severely impaired, while her speed to complete Trails A (with simpler visual stimuli) was average. Visual-spatial construction of a complex geometric figure, and visual perception on a line orientation task, were severely impaired on the RBANS. Perceptual reasoning was borderline impaired on the WAIS-IV, compared to average verbal abstract reasoning on the WAIS-IV. Language abilities were a strength overall. Confrontation naming was average. Semantic verbal fluency was low average.   Attention and working memory were low average-also representing an area of relative strength despite  this likely being a mild to moderate decline from baseline. (Of note, the tasks used to measure this did not have any visual component.)   Verbal memory was variable but overall not as impaired as other areas of function (specifically, not as impaired as visual-spatial function). Encoding and acquisition of non-contextual information (16-item word list) was low average. After a brief interference task, free recall was borderline impaired. She did not benefit from semantic cueing. After a 20 minute delay, free recall was low average. Again, there was no benefit from semantic cueing. Performance on a yes/no recognition task was low average overall although she demonstrated  significantly elevated number of false positive errors. Encoding and acquisition of contextual auditory information (i.e., two short stories, no repetition of either one) was borderline impaired. After a 20 minute delay, free recall was borderline impaired.   Non-verbal memory was assessed one task and fell within the impaired range. Of course, poor initial rendering of the information was likely at least partially contributory.   Executive functioning was variable. Mental flexibility and set-shifting were severely impaired; she was unable to complete Trails B. Verbal fluency with phonemic search restrictions was high average, an area of relative strength. As noted previously, verbal abstract reasoning was average. Non-verbal abstract reasoning was low average/borderline impaired. Performance on a clock drawing task was impaired. She was able to construct the clock face and place the numbers, but she drew three hands.  On self-report questionnaires, the patient's responses were not indicative of clinically significant depression or anxiety. She noted that she does feel stress and sadness associated with her husband's terminal illness, but she is not sad or anxious all the time. However, I do suspect that she minimized symptoms of anxiety somewhat on the questionnaire, based on her self-report at her feedback appointment.  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 Results of this evaluation clearly are abnormal. There is nothing to suggest poor effort or feigning of symptoms. Furthermore, it appears that her cognitive deficits are beginning to interfere with her ability to manage complex tasks, such as finances and driving. As such, diagnostic criteria for a dementia syndrome are met.   The patient's cognitive profile is reflective of significant visual-spatial processing deficits with relatively preserved language and attention skills. 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 While the underlying cause of PCA is unknown in this patient's case, PCA is attributable to Alzheimer's disease in most patients and therefore is often considered an atypical variant of AD.   Recommendations:  1. Education regarding mild dementia and PCA was provided to the patient and her husband. Written materials were provided. Emotional support was provided.  2. The patient will require assistance with complex ADLs (especially those involving visual-spatial processing, such as driving and finances). I recommended that she stops driving at this point, based on her performance on all visual-spatial tasks and on a cognitive test highly correlated with driving ability. She and her husband understand and agree with this. 3. Cholinesterase inhibitor therapies have not been well researched in patients with PCA. However, this could be considered. The patient will discuss this with Dr. Posey Pronto. 4. Psychotropic treatment of anxiety is requested by the patient and appears warranted. I would recommend an SSRI (e.g., Lexapro, Zoloft).  5. Neuropsychological evaluation in 9 months is indicated in order to monitor cognitive  status, track progression of symptoms and further assist with treatment planning.    Feedback to Patient: Maizy Davanzo returned for a feedback appointment on 01/30/2016 to review the results of her neuropsychological evaluation with this provider. 60 minutes face-to-face time was spent reviewing her test results, my impressions and my recommendations as detailed above.    A total of six hours (6 units of CPT [phone]) were spent reviewing medical records, administrating and scoring neuropsychological tests, interpreting test results, preparing this report and providing results to the  patient.    Thank you for your referral of Coralynn Gaona. Please feel free to contact me if you have any questions or concerns regarding this report.

## 2016-02-07 DIAGNOSIS — F4323 Adjustment disorder with mixed anxiety and depressed mood: Secondary | ICD-10-CM | POA: Diagnosis not present

## 2016-02-15 ENCOUNTER — Encounter: Payer: Self-pay | Admitting: Neurology

## 2016-02-15 ENCOUNTER — Ambulatory Visit (INDEPENDENT_AMBULATORY_CARE_PROVIDER_SITE_OTHER): Payer: BLUE CROSS/BLUE SHIELD | Admitting: Neurology

## 2016-02-15 VITALS — BP 110/74 | HR 71 | Ht 66.0 in | Wt 152.4 lb

## 2016-02-15 DIAGNOSIS — G319 Degenerative disease of nervous system, unspecified: Secondary | ICD-10-CM | POA: Diagnosis not present

## 2016-02-15 MED ORDER — DONEPEZIL HCL 10 MG PO TABS: ORAL_TABLET | ORAL | Status: DC

## 2016-02-15 NOTE — Progress Notes (Signed)
Note routed

## 2016-02-15 NOTE — Patient Instructions (Addendum)
Start aricept 5mg  (half-tablet) daily for one month, then increase to 1 tablet daily   Follow-up with your primary care doctor about anxiety treatment  Return to clinic 6 months

## 2016-02-15 NOTE — Progress Notes (Signed)
Follow-up Visit   Date: 02/15/2016    Margaret Johnston MRN: [id number] DOB: 1956-04-13   Interim History: Margaret Johnston is a 60 y.o. 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  The patient was accompanied to the clinic by husband.  History of present illness: Her husband retired in August 2015 because of newly diagnosed neuroendocrine pancreatic cancer. Around this time, she began noticing that that she could not always remember the date. She had been managing finances for a number of years but in 2016, she delegated the responsibilities to her husband because of difficulty with numbers. She would often make errors such as saying "100" instead of "1000". She has restricted her driving to local distances only. She has not been involved in any MVAs. She has interests such as sewing and crochet and volunteers for the church. Sleep is good. Mood is good. Her husband states that there is some anxiety because of his medical problems since he is being monitored every 3 months for recurrence. She does all the cleaning and cooking at home. She has seven grandchildren, all in [place], except for one in [place] so they travel often.   UPDATE 02/15/2016:  She underwent neuropsychological testing which was most consistent with posterior cortical atrophy and presents with her husband as follow-up.  They have been researching the disease and have many questions.  They are very concerned about how to tell their children the diagnosis and sough the advice of a psychologist.  She had not noticed any significant changes since her last visit.  She endorses mild depression.  Medications:  Current Outpatient Prescriptions on File Prior to Visit  Medication Sig Dispense Refill  . calcium carbonate (OS-CAL) 600 MG TABS Take 600 mg by mouth daily.    . cetirizine (ZYRTEC) 10 MG tablet  Take 10 mg by mouth daily as needed. allergies    . ibuprofen (ADVIL,MOTRIN) 800 MG tablet     . levothyroxine (SYNTHROID, LEVOTHROID) 100 MCG tablet Take 100 mcg by mouth daily before breakfast.     . Multiple Vitamin (MULTIVITAMIN WITH MINERALS) TABS Take 1 tablet by mouth daily.    Marland Kitchen omeprazole (PRILOSEC) 40 MG capsule     . Tamsulosin HCl (FLOMAX) 0.4 MG CAPS Take 0.4 mg by mouth daily.    . Vitamin D, Ergocalciferol, (DRISDOL) 50000 units CAPS capsule      No current facility-administered medications on file prior to visit.    Allergies:  Allergies  Allergen Reactions  . Hydrocodone-Acetaminophen Other (See Comments)    Other  . Hydromorphone Itching  . Oxycodone Itching    Review of Systems:  CONSTITUTIONAL: No fevers, chills, night sweats, or weight loss.  EYES: No visual changes or eye pain ENT: No hearing changes.  No history of nose bleeds.   RESPIRATORY: No cough, wheezing and shortness of breath.   CARDIOVASCULAR: Negative for chest pain, and palpitations.   GI: Negative for abdominal discomfort, blood in stools or black stools.  No recent change in bowel habits.   GU:  No history of incontinence.   MUSCLOSKELETAL: No history of joint pain or swelling.  No myalgias.   SKIN: Negative for lesions, rash, and itching.   ENDOCRINE: Negative for cold or heat intolerance, polydipsia or goiter.   PSYCH:  + depression or anxiety symptoms.   NEURO: As Above.   Vital Signs:  BP 110/74 mmHg  Pulse  71  Ht '5\' 6"'  (1.676 m)  Wt 152 lb 6 oz (69.117 kg)  BMI 24.61 kg/m2  SpO2 98%  Neurological Exam: MENTAL STATUS including orientation to time, place, person, recent and remote memory, attention span and concentration, language, and fund of knowledge is fair.  Blunted affect.  Engages in conversation appropriately.  CRANIAL NERVES:   Face is symmetric.  MOTOR:  Motor strength is 5/5 in all extremities  COORDINATION/GAIT:   Gait narrow based and stable.   Data: Labs  12/07/2015:  Vitamin B12 510, vitamin E 16.7  MRI brain 12/12/2015:  Mild global atrophy most notable involving parietal lobes.  Partially calcified pineal gland with minimal impression upon the superior colliculus may represent an incidental finding. Follow-up as noted above.  Superficial aspect of the posterior portion of the right parotid gland nonspecific T2 bright slightly heterogeneous 1.6 x 1 x 1.4 cm lesion. ENT consultation recommended for further delineation.  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 Results of this evaluation clearly are abnormal. There is nothing to suggest poor effort or feigning of symptoms. Furthermore, it appears that her cognitive deficits are beginning to interfere with her ability to manage complex tasks, such as finances and driving. As such, diagnostic criteria for a dementia syndrome are met.   The patient's cognitive profile is reflective of significant visual-spatial processing deficits with relatively preserved language and attention skills. 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 While the underlying cause of PCA is unknown in this patient's case, PCA is attributable to Alzheimer's disease in most patients and therefore is often considered an atypical variant of AD.   Recommendations:  1. Education regarding mild dementia and PCA was provided to the patient and her husband. Written materials were provided. Emotional support was provided. 2. The patient will require assistance with complex ADLs (especially those involving visual-spatial processing, such as driving and finances). I recommended that she stops driving at this point, based on her performance on all visual-spatial tasks  and on a cognitive test highly correlated with driving ability. She and her husband understand and agree with this. 3. Cholinesterase inhibitor therapies have not been well researched in patients with PCA. However, this could be considered. The patient will discuss this with Dr. Posey Pronto. 4. Psychotropic treatment of anxiety is requested by the patient and appears warranted. I would recommend an SSRI (e.g., Lexapro, Zoloft). 5. Neuropsychological evaluation in 9 months is indicated in order to monitor cognitive status, track progression of symptoms and further assist with treatment planning.  IMPRESSION/PLAN: 1.  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  - Testing of neuropsychological testing and MRI brain was reviewed which does show parietal volume loss  - Patient and husband had many questions about the diagnosis, prognosis, and management options which I addressed to the best of my ability  - Start Aricept 9m x 1 month, then increase to 167mdaily  - Encouraged daily exercise and Mediterranean diet   - Recommend that she engage in mentally stimulating activities (puzzles, crosswords, music, etc)  - Do not recommend that she drives, which she has stopped self-willingly  - Home safety issues discussed  - We also briefly discussed setting up POA  2.  Follow-up with PCP about anxiety/depression  3.  Follow-up with ENT for parotid abnormalities   The duration  of this appointment visit was 45 minutes of face-to-face time with the patient.  Greater than 50% of this time was spent in counseling, explanation of diagnosis, planning of further management, and coordination of care.   Thank you for allowing me to participate in patient's care.  If I can answer any additional questions, I would be pleased to do so.    Sincerely,    Donika K. Posey Pronto, DO

## 2016-03-20 DIAGNOSIS — E559 Vitamin D deficiency, unspecified: Secondary | ICD-10-CM | POA: Diagnosis not present

## 2016-03-20 DIAGNOSIS — E785 Hyperlipidemia, unspecified: Secondary | ICD-10-CM | POA: Diagnosis not present

## 2016-03-20 DIAGNOSIS — Z0000 Encounter for general adult medical examination without abnormal findings: Secondary | ICD-10-CM | POA: Diagnosis not present

## 2016-03-25 DIAGNOSIS — Z1211 Encounter for screening for malignant neoplasm of colon: Secondary | ICD-10-CM | POA: Diagnosis not present

## 2016-03-25 DIAGNOSIS — Z0000 Encounter for general adult medical examination without abnormal findings: Secondary | ICD-10-CM | POA: Diagnosis not present

## 2016-03-25 DIAGNOSIS — Z23 Encounter for immunization: Secondary | ICD-10-CM | POA: Diagnosis not present

## 2016-04-24 DIAGNOSIS — Z1231 Encounter for screening mammogram for malignant neoplasm of breast: Secondary | ICD-10-CM | POA: Diagnosis not present

## 2016-04-24 DIAGNOSIS — Z803 Family history of malignant neoplasm of breast: Secondary | ICD-10-CM | POA: Diagnosis not present

## 2016-05-02 DIAGNOSIS — N134 Hydroureter: Secondary | ICD-10-CM | POA: Diagnosis not present

## 2016-05-02 DIAGNOSIS — N132 Hydronephrosis with renal and ureteral calculous obstruction: Secondary | ICD-10-CM | POA: Diagnosis not present

## 2016-05-02 DIAGNOSIS — F0280 Dementia in other diseases classified elsewhere without behavioral disturbance: Secondary | ICD-10-CM | POA: Diagnosis not present

## 2016-05-02 DIAGNOSIS — Z79899 Other long term (current) drug therapy: Secondary | ICD-10-CM | POA: Diagnosis not present

## 2016-05-02 DIAGNOSIS — E079 Disorder of thyroid, unspecified: Secondary | ICD-10-CM | POA: Diagnosis not present

## 2016-05-02 DIAGNOSIS — G309 Alzheimer's disease, unspecified: Secondary | ICD-10-CM | POA: Diagnosis not present

## 2016-05-02 DIAGNOSIS — N201 Calculus of ureter: Secondary | ICD-10-CM | POA: Diagnosis not present

## 2016-05-02 DIAGNOSIS — R110 Nausea: Secondary | ICD-10-CM | POA: Diagnosis not present

## 2016-05-02 DIAGNOSIS — R1030 Lower abdominal pain, unspecified: Secondary | ICD-10-CM | POA: Diagnosis not present

## 2016-05-02 DIAGNOSIS — R109 Unspecified abdominal pain: Secondary | ICD-10-CM | POA: Diagnosis not present

## 2016-05-02 DIAGNOSIS — Z87442 Personal history of urinary calculi: Secondary | ICD-10-CM | POA: Diagnosis not present

## 2016-05-23 DIAGNOSIS — E039 Hypothyroidism, unspecified: Secondary | ICD-10-CM | POA: Diagnosis not present

## 2016-05-27 DIAGNOSIS — E039 Hypothyroidism, unspecified: Secondary | ICD-10-CM | POA: Diagnosis not present

## 2016-05-27 DIAGNOSIS — R413 Other amnesia: Secondary | ICD-10-CM | POA: Diagnosis not present

## 2016-05-27 DIAGNOSIS — Z23 Encounter for immunization: Secondary | ICD-10-CM | POA: Diagnosis not present

## 2016-05-27 DIAGNOSIS — F320 Major depressive disorder, single episode, mild: Secondary | ICD-10-CM | POA: Diagnosis not present

## 2016-06-05 ENCOUNTER — Telehealth: Payer: Self-pay | Admitting: Neurology

## 2016-06-05 NOTE — Telephone Encounter (Signed)
Is this something you discussed with them?

## 2016-06-05 NOTE — Telephone Encounter (Signed)
Margaret Johnston Marie Cabal 10/20/2055. Her husband called wanting to speak with Dr. [name] regarding her Alzheimer's Disease and Social Security disability. He wants to be able to start the process. His # is [phone]. Thank you

## 2016-06-05 NOTE — Telephone Encounter (Signed)
I am happy to complete paperwork as needed.  Can you see what specific questions he has?  Sometimes these concerns are better addressed in the office and they may want to schedule a return visit to discuss further.

## 2016-06-06 NOTE — Telephone Encounter (Signed)
I spoke with Margaret Johnston and he said that they have an appt on October 24.

## 2016-06-17 IMAGING — US US ABDOMEN COMPLETE
1 series · 14 of 25 positions shown · non-contrast
Comparison: None.

CLINICAL DATA: Right upper quadrant pain for several years.

EXAM:
ULTRASOUND ABDOMEN COMPLETE

[Series 1: us abdomen complete · 0.22mm/px · 14 of 72 slices shown]
[im 1/72]
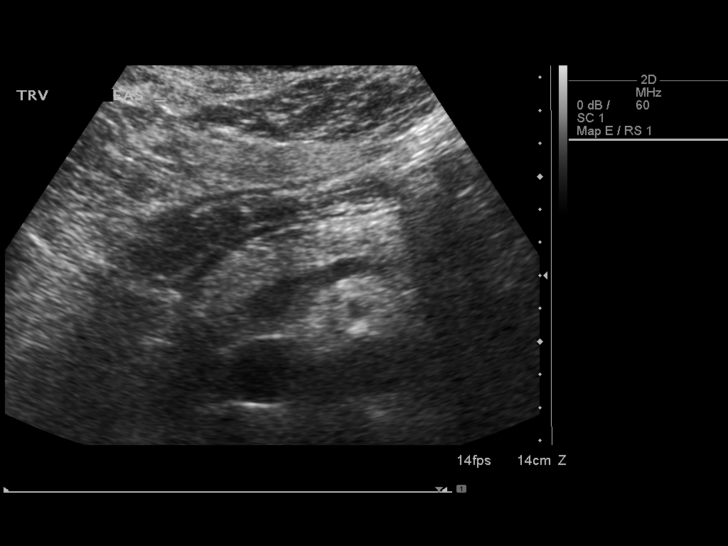
[im 6/72]
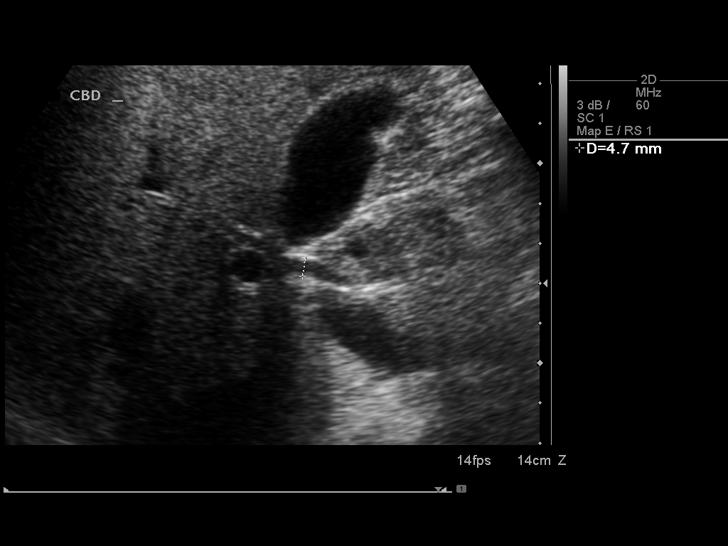
[im 12/72]
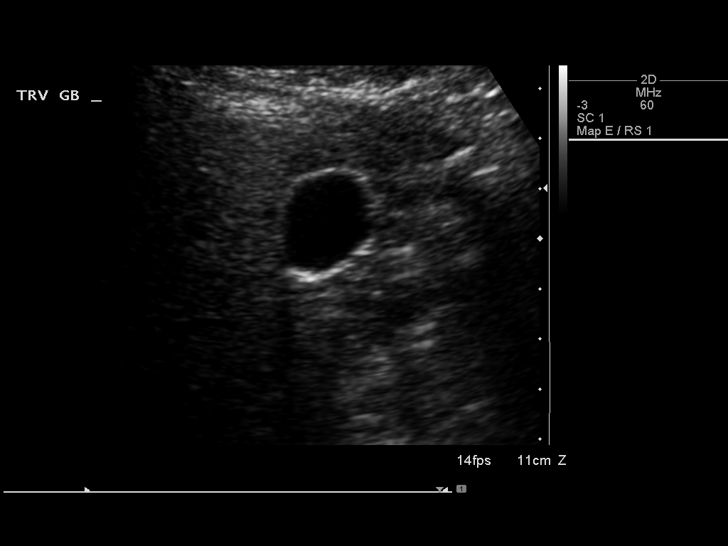
[im 18/72]
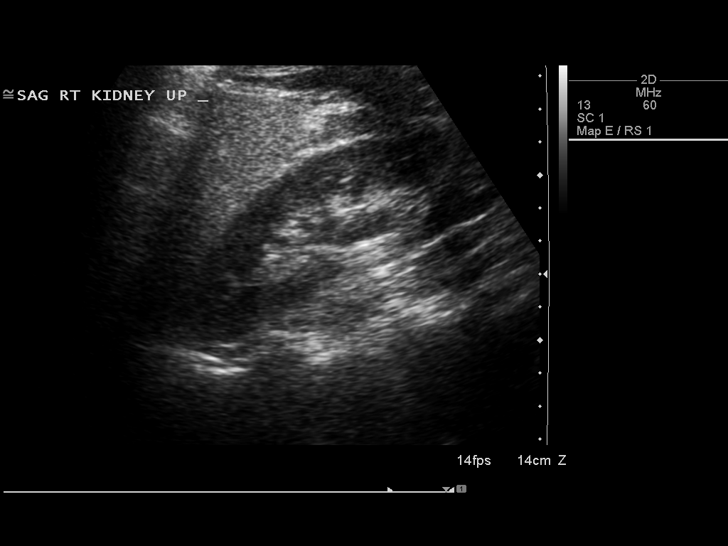
[im 24/72]
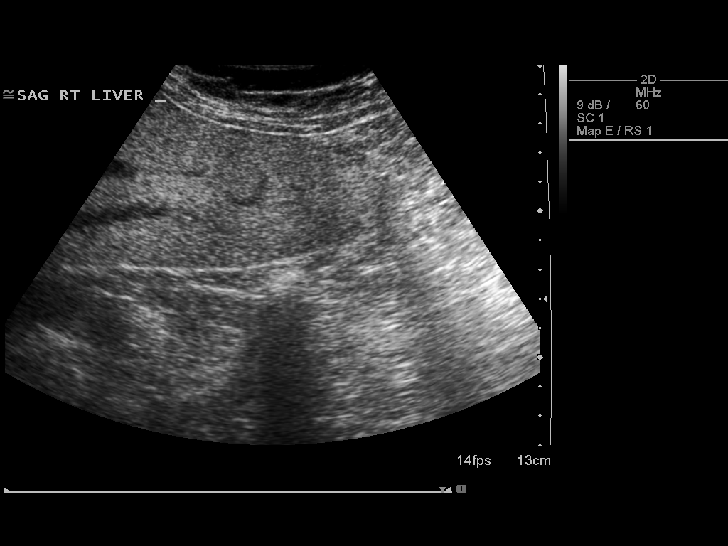
[im 27/72]
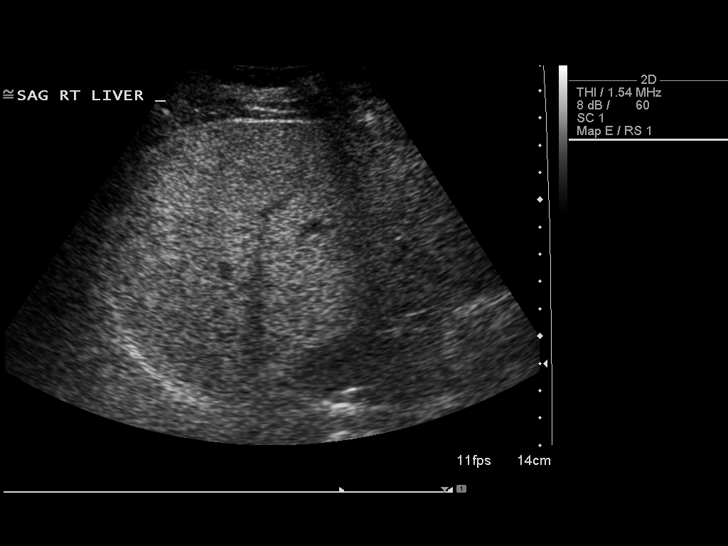
[im 33/72]
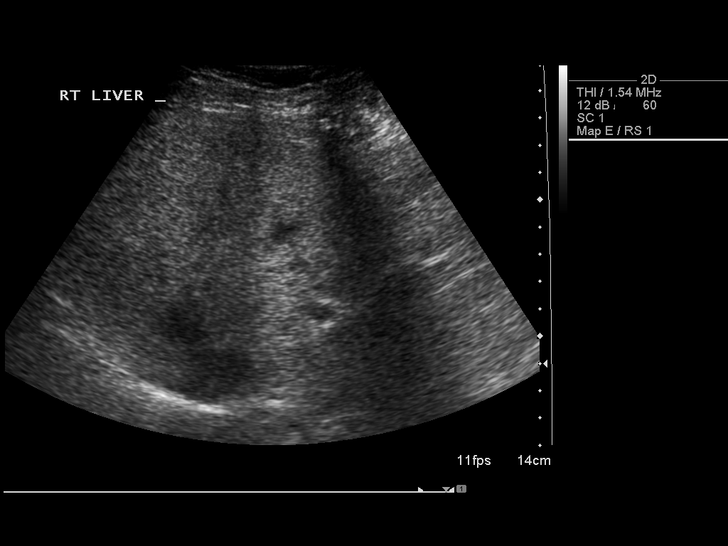
[im 39/72]
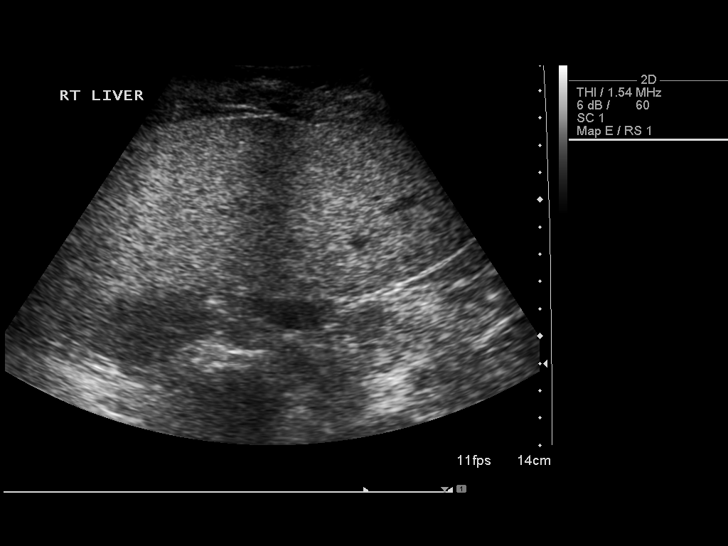
[im 45/72]
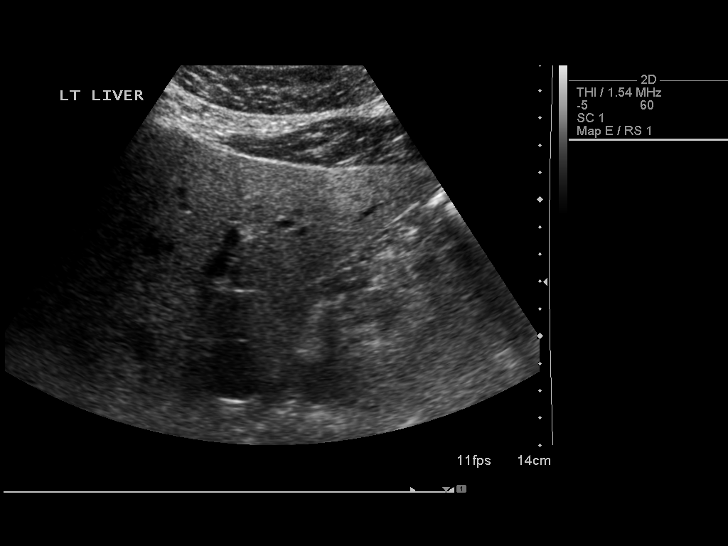
[im 48/72]
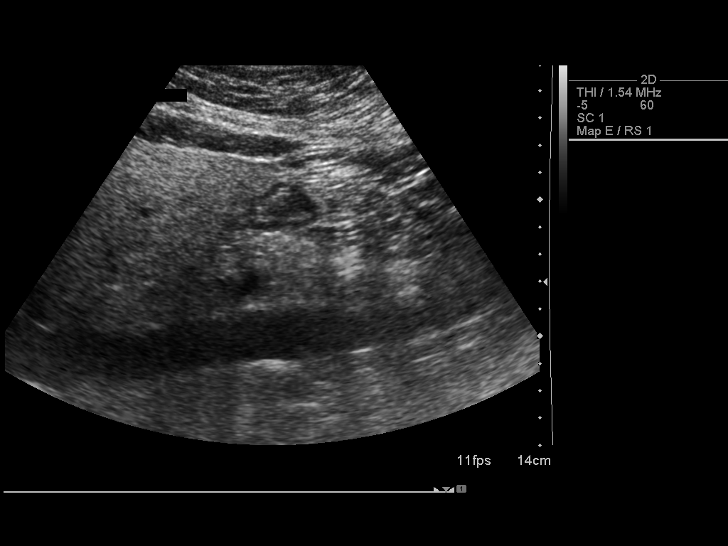
[im 54/72]
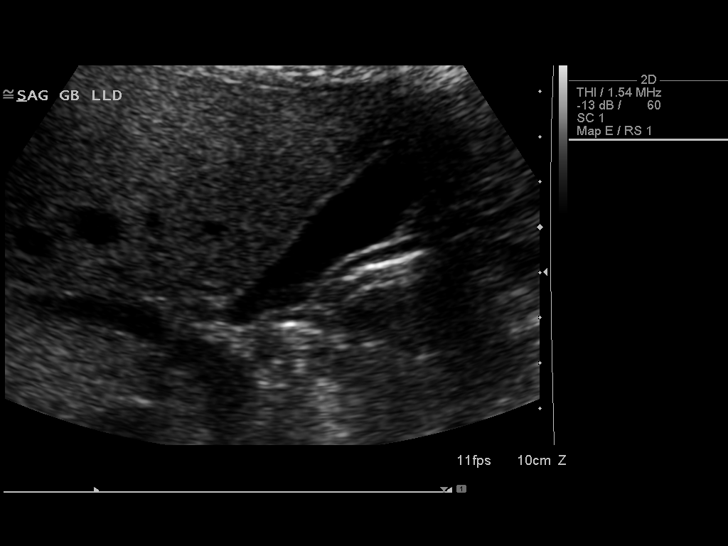
[im 60/72]
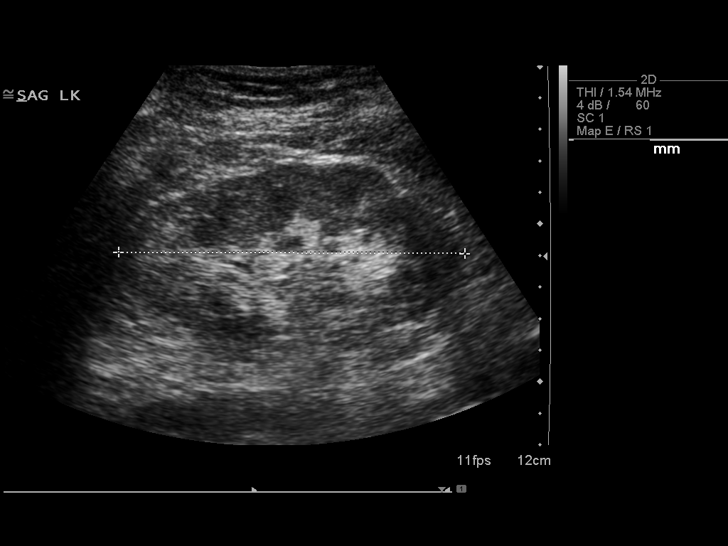
[im 66/72]
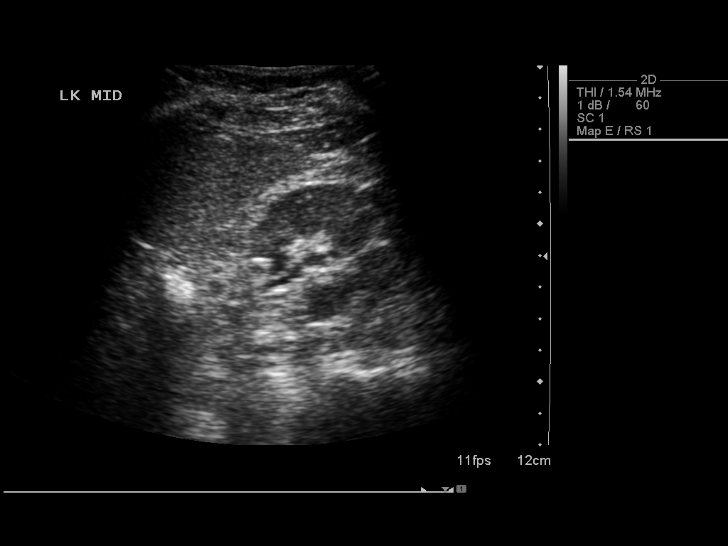
[im 72/72]
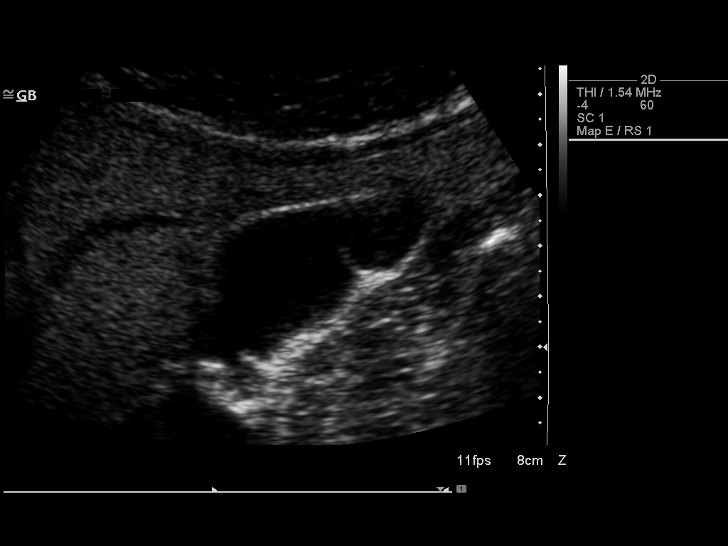

[14 of 25 positions shown; findings below may reference images not displayed]

FINDINGS: Gallbladder: No gallstones or wall thickening visualized. No
sonographic Murphy sign noted.

Common bile duct: Diameter: 4.7 mm

Liver: Liver is hyperechoic and inhomogeneous consistent with fatty
infiltration and hepatocellular disease.

IVC: No abnormality visualized.

Pancreas: Visualized portion unremarkable.

Spleen: Size and appearance within normal limits.

Right Kidney: Length: 10.2 cm. Echogenicity within normal limits. No
mass or hydronephrosis visualized.

Left Kidney: Length: 11.0 cm. Echogenicity within normal limits. No
mass or hydronephrosis visualized.

Abdominal aorta: No aneurysm visualized.

Other findings: None.
IMPRESSION: Liver is hyperechoic and inhomogeneous consistent with fatty
infiltration and/or hepatocellular disease.

## 2016-06-26 DIAGNOSIS — Z6823 Body mass index (BMI) 23.0-23.9, adult: Secondary | ICD-10-CM | POA: Diagnosis not present

## 2016-06-26 DIAGNOSIS — Z01419 Encounter for gynecological examination (general) (routine) without abnormal findings: Secondary | ICD-10-CM | POA: Diagnosis not present

## 2016-07-02 ENCOUNTER — Encounter: Payer: Self-pay | Admitting: Neurology

## 2016-07-02 ENCOUNTER — Encounter: Payer: Self-pay | Admitting: *Deleted

## 2016-07-02 ENCOUNTER — Ambulatory Visit (INDEPENDENT_AMBULATORY_CARE_PROVIDER_SITE_OTHER): Payer: BLUE CROSS/BLUE SHIELD | Admitting: Neurology

## 2016-07-02 VITALS — BP 100/64 | HR 62 | Ht 66.0 in | Wt 143.4 lb

## 2016-07-02 DIAGNOSIS — G300 Alzheimer's disease with early onset: Secondary | ICD-10-CM

## 2016-07-02 DIAGNOSIS — F0280 Dementia in other diseases classified elsewhere without behavioral disturbance: Secondary | ICD-10-CM | POA: Diagnosis not present

## 2016-07-02 DIAGNOSIS — G319 Degenerative disease of nervous system, unspecified: Secondary | ICD-10-CM | POA: Diagnosis not present

## 2016-07-02 NOTE — Patient Instructions (Addendum)
1.  Continue Aricept 10mg  daily 2.  You are welcome to visit www.lumosity.com 3.  [facility] [place] Tel [place]:  [phone] www.[facility].org/resources.cfm   Resources for common questions found under "Pathways & Protocols " www.[facility].org/pathways/Pathways_Menu.htm  Return to clinic in 6 months

## 2016-07-02 NOTE — Progress Notes (Signed)
Follow-up Visit   Date: 07/02/16     Margaret Johnston MRN: [id number] DOB: April 20, 1956   Interim History: Margaret Johnston is a 60 y.o. 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  The patient was accompanied to the clinic by husband.  History of present illness: Her husband retired in August 2015 because of newly diagnosed neuroendocrine pancreatic cancer. Around this time, she began noticing that that she could not always remember the date. She had been managing finances for a number of years but in 2016, she delegated the responsibilities to her husband because of difficulty with numbers. She would often make errors such as saying "100" instead of "1000". She has restricted her driving to local distances only. She has not been involved in any MVAs. She has interests such as sewing and crochet and volunteers for the church. Sleep is good. Mood is good. Her husband states that there is some anxiety because of his medical problems since he is being monitored every 3 months for recurrence. She does all the cleaning and cooking at home. She has seven grandchildren, all in [place], except for one in [place] so they travel often.   UPDATE 02/15/2016:  She underwent neuropsychological testing which was most consistent with posterior cortical atrophy and presents with her husband as follow-up.  They have been researching the disease and have many questions.  They are very concerned about how to tell their children the diagnosis and sough the advice of a psychologist.  She had not noticed any significant changes since her last visit.  She endorses mild depression.  UPDATE 07/02/2016:   Since is tolerating Aricept 58m well and feels more alert taking the medication.  Her husband has noticed that she is talking in her sleep more and she endorses more vivid dreams.  They have  started on Turmeric and vitamin D supplements.  They have many questions regarding cognitive supplements.  She was started on social security benefits due to her dementia and are requesting a letter for disability benefits.  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  She is exercising daily, getting up to 5,000-10,000 steps/daily and has lost 10lb.  She enjoys scrabble online.    Medications:  Current Outpatient Prescriptions on File Prior to Visit  Medication Sig  . calcium carbonate (OS-CAL) 600 MG TABS Take 600 mg by mouth daily.  . cetirizine (ZYRTEC) 10 MG tablet Take 10 mg by mouth daily as needed. allergies  . donepezil (ARICEPT) 10 MG tablet Take half-tablet for one month, then increase to 1 tablet daily.  . fenofibrate 160 MG tablet   . ibuprofen (ADVIL,MOTRIN) 800 MG tablet   . levothyroxine (SYNTHROID, LEVOTHROID) 100 MCG tablet Take 100 mcg by mouth daily before breakfast.   . mometasone (NASONEX) 50 MCG/ACT nasal spray Place into the nose.  . montelukast (SINGULAIR) 10 MG tablet   . Multiple Vitamin (MULTIVITAMIN WITH MINERALS) TABS Take 1 tablet by mouth daily.  .Marland Kitchenomeprazole (PRILOSEC) 40 MG capsule   . Tamsulosin HCl (FLOMAX) 0.4 MG CAPS Take 0.4 mg by mouth daily.  . Vitamin D, Ergocalciferol, (DRISDOL) 50000 units CAPS capsule    No current facility-administered medications on file prior to visit.    Allergies:  Allergies  Allergen Reactions  . Hydrocodone-Acetaminophen Other (See Comments)  Other  . Hydromorphone Itching  . Oxycodone Itching    Review of Systems:  CONSTITUTIONAL: No fevers, chills, night sweats, + intentional weight loss.  EYES: No visual changes or eye pain ENT: No hearing changes.  No history of nose bleeds.   RESPIRATORY: No cough, wheezing and shortness of breath.   CARDIOVASCULAR: Negative for chest pain, and  palpitations.   GI: Negative for abdominal discomfort, blood in stools or black stools.  No recent change in bowel habits.   GU:  No history of incontinence.   MUSCLOSKELETAL: No history of joint pain or swelling.  No myalgias.   SKIN: Negative for lesions, rash, and itching.   ENDOCRINE: Negative for cold or heat intolerance, polydipsia or goiter.   PSYCH:  + depression or anxiety symptoms.   NEURO: As Above.   Vital Signs:  Vitals:   07/02/16 0816  BP: 100/64  Pulse: 62  SpO2: 98%  Weight: 143 lb 7 oz (65.1 kg)  Height: '5\' 6"'  (1.676 m)   Neurological Exam: MENTAL STATUS including orientation to time, place, person, recent and remote memory, attention span and concentration, language, and fund of knowledge is fair.  Blunted affect.  Engages in conversation appropriately.  CRANIAL NERVES: Pupils are round and reactive to light. Extraocular muscles are intact. Visual fields are intact to confrontation.  Face is symmetric. Palate elevates symmetrically. Tongue is midline.  MOTOR:  Motor strength is 5/5 in all extremities  COORDINATION/GAIT:   Gait narrow based and stable.   Data: Labs 12/07/2015:  Vitamin B12 510, vitamin E 16.7  MRI brain 12/12/2015:  Mild global atrophy most notable involving parietal lobes.  Partially calcified pineal gland with minimal impression upon the superior colliculus may represent an incidental finding. Follow-up as noted above.  Superficial aspect of the posterior portion of the right parotid gland nonspecific T2 bright slightly heterogeneous 1.6 x 1 x 1.4 cm lesion. ENT consultation recommended for further delineation.  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 Results of this evaluation clearly are abnormal. There is nothing to suggest poor effort or feigning of symptoms. Furthermore, it appears that her cognitive deficits are beginning to interfere with her ability to manage  complex tasks, such as finances and driving. As such, diagnostic criteria for a dementia syndrome are met.   The patient's cognitive profile is reflective of significant visual-spatial processing deficits with relatively preserved language and attention skills. 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 While the underlying cause of PCA is unknown in this patient's case, PCA is attributable to Alzheimer's disease in most patients and therefore is often considered an atypical variant of AD.   Recommendations: 1. Education regarding mild dementia and PCA was provided to the patient and her husband. Written materials were provided. Emotional support was provided. 2. The patient will require assistance with complex ADLs (especially those involving visual-spatial processing, such as driving and finances). I recommended that she stops driving at this point, based on her performance on all visual-spatial tasks and on a cognitive test highly correlated with driving ability. She and her husband understand and agree with this. 3. Cholinesterase inhibitor therapies have not been well researched in patients with PCA. However, this could be considered. The patient will discuss this with Dr. Posey Pronto. 4. Psychotropic treatment of anxiety is requested by the patient  and appears warranted. I would recommend an SSRI (e.g., Lexapro, Zoloft). 5. Neuropsychological evaluation in 9 months is indicated in order to monitor cognitive status, track progression of symptoms and further assist with treatment planning.  IMPRESSION/PLAN: Posterior cortical atrophy, variant of Alzheimer's dementia.  Clinically stable, she is no longer driving or working due to cognitive impairment  - Testing of neuropsychological testing and MRI brain was  reviewed which does show parietal volume loss  - Patient and husband had many questions about the diagnosis, prognosis, and management options which I reviewed readdressed.   - They also had many questions about nutritional supplements for cognitive health, which they are welcome to explore independently however this is not best practice or evidence-based management.  - Continue Aricept 55m daily  - Encouraged daily exercise and Mediterranean diet   - Recommend that she engage in mentally stimulating activities (puzzles, crosswords, music, etc) or register with www.lumosity.com  - Senior care resources provided  - Letter will be provided in support of disability benefits  Return to clinic in 6 months  The duration of this appointment visit was 40 minutes of face-to-face time with the patient.  Greater than 50% of this time was spent in counseling, explanation of diagnosis, planning of further management, and coordination of care.   Thank you for allowing me to participate in patient's care.  If I can answer any additional questions, I would be pleased to do so.    Sincerely,    Alivea Gladson K. PPosey Pronto DO

## 2016-07-02 NOTE — Progress Notes (Signed)
Weight is in.

## 2016-07-11 DIAGNOSIS — H02831 Dermatochalasis of right upper eyelid: Secondary | ICD-10-CM | POA: Diagnosis not present

## 2016-07-11 DIAGNOSIS — H524 Presbyopia: Secondary | ICD-10-CM | POA: Diagnosis not present

## 2016-07-11 DIAGNOSIS — Z961 Presence of intraocular lens: Secondary | ICD-10-CM | POA: Diagnosis not present

## 2016-07-11 DIAGNOSIS — H02401 Unspecified ptosis of right eyelid: Secondary | ICD-10-CM | POA: Diagnosis not present

## 2016-07-21 ENCOUNTER — Emergency Department (HOSPITAL_COMMUNITY): Payer: BLUE CROSS/BLUE SHIELD

## 2016-07-21 ENCOUNTER — Encounter (HOSPITAL_COMMUNITY): Payer: Self-pay | Admitting: *Deleted

## 2016-07-21 ENCOUNTER — Emergency Department (HOSPITAL_COMMUNITY)
Admission: EM | Admit: 2016-07-21 | Discharge: 2016-07-21 | Disposition: A | Discharge status: Home / Self Care | Payer: BLUE CROSS/BLUE SHIELD | Attending: Emergency Medicine | Admitting: Emergency Medicine

## 2016-07-21 DIAGNOSIS — N189 Chronic kidney disease, unspecified: Secondary | ICD-10-CM | POA: Insufficient documentation

## 2016-07-21 DIAGNOSIS — R109 Unspecified abdominal pain: Secondary | ICD-10-CM | POA: Diagnosis present

## 2016-07-21 DIAGNOSIS — G309 Alzheimer's disease, unspecified: Secondary | ICD-10-CM | POA: Insufficient documentation

## 2016-07-21 DIAGNOSIS — Z79899 Other long term (current) drug therapy: Secondary | ICD-10-CM | POA: Diagnosis not present

## 2016-07-21 DIAGNOSIS — N201 Calculus of ureter: Secondary | ICD-10-CM

## 2016-07-21 DIAGNOSIS — N132 Hydronephrosis with renal and ureteral calculous obstruction: Secondary | ICD-10-CM | POA: Diagnosis not present

## 2016-07-21 DIAGNOSIS — Z791 Long term (current) use of non-steroidal anti-inflammatories (NSAID): Secondary | ICD-10-CM | POA: Diagnosis not present

## 2016-07-21 DIAGNOSIS — E039 Hypothyroidism, unspecified: Secondary | ICD-10-CM | POA: Insufficient documentation

## 2016-07-21 DIAGNOSIS — R10A0 Flank pain, unspecified side: Secondary | ICD-10-CM

## 2016-07-21 HISTORY — DX: Alzheimer's disease with early onset: G30.0

## 2016-07-21 HISTORY — DX: Dementia in other diseases classified elsewhere, unspecified severity, without behavioral disturbance, psychotic disturbance, mood disturbance, and anxiety: F02.80

## 2016-07-21 LAB — CBC WITH DIFFERENTIAL/PLATELET
Basophils Absolute: 0 K/uL (ref 0.0–0.1)
Basophils Relative: 0 %
Eosinophils Absolute: 0.4 K/uL (ref 0.0–0.7)
Eosinophils Relative: 4 %
HCT: 40 % (ref 36.0–46.0)
Hemoglobin: 13.6 g/dL (ref 12.0–15.0)
Lymphocytes Relative: 19 %
Lymphs Abs: 2 K/uL (ref 0.7–4.0)
MCH: 31 pg (ref 26.0–34.0)
MCHC: 34 g/dL (ref 30.0–36.0)
MCV: 91.1 fL (ref 78.0–100.0)
Monocytes Absolute: 1.4 K/uL — ABNORMAL HIGH (ref 0.1–1.0)
Monocytes Relative: 13 %
Neutro Abs: 6.9 K/uL (ref 1.7–7.7)
Neutrophils Relative %: 64 %
Platelets: 286 K/uL (ref 150–400)
RBC: 4.39 MIL/uL (ref 3.87–5.11)
RDW: 12.4 % (ref 11.5–15.5)
WBC: 10.8 K/uL — ABNORMAL HIGH (ref 4.0–10.5)

## 2016-07-21 LAB — URINE MICROSCOPIC-ADD ON: Bacteria, UA: NONE SEEN

## 2016-07-21 LAB — URINALYSIS, ROUTINE W REFLEX MICROSCOPIC
Bilirubin Urine: NEGATIVE
Glucose, UA: NEGATIVE mg/dL
Ketones, ur: NEGATIVE mg/dL
Leukocytes, UA: NEGATIVE
Nitrite: NEGATIVE
Protein, ur: NEGATIVE mg/dL
Specific Gravity, Urine: 1.021 (ref 1.005–1.030)
pH: 6 (ref 5.0–8.0)

## 2016-07-21 LAB — I-STAT CHEM 8, ED
BUN: 26 mg/dL — ABNORMAL HIGH (ref 6–20)
Calcium, Ion: 1.16 mmol/L (ref 1.15–1.40)
Chloride: 107 mmol/L (ref 101–111)
Creatinine, Ser: 1 mg/dL (ref 0.44–1.00)
Glucose, Bld: 106 mg/dL — ABNORMAL HIGH (ref 65–99)
HCT: 40 % (ref 36.0–46.0)
Hemoglobin: 13.6 g/dL (ref 12.0–15.0)
Potassium: 4.5 mmol/L (ref 3.5–5.1)
Sodium: 142 mmol/L (ref 135–145)
TCO2: 26 mmol/L (ref 0–100)

## 2016-07-21 LAB — COMPREHENSIVE METABOLIC PANEL WITH GFR
ALT: 20 U/L (ref 14–54)
AST: 20 U/L (ref 15–41)
Albumin: 4.4 g/dL (ref 3.5–5.0)
Alkaline Phosphatase: 51 U/L (ref 38–126)
Anion gap: 5 (ref 5–15)
BUN: 20 mg/dL (ref 6–20)
CO2: 25 mmol/L (ref 22–32)
Calcium: 9.5 mg/dL (ref 8.9–10.3)
Chloride: 109 mmol/L (ref 101–111)
Creatinine, Ser: 1.04 mg/dL — ABNORMAL HIGH (ref 0.44–1.00)
GFR calc Af Amer: >60 mL/min
GFR calc non Af Amer: 56 mL/min — ABNORMAL LOW
Glucose, Bld: 110 mg/dL — ABNORMAL HIGH (ref 65–99)
Potassium: 4 mmol/L (ref 3.5–5.1)
Sodium: 139 mmol/L (ref 135–145)
Total Bilirubin: 0.3 mg/dL (ref 0.3–1.2)
Total Protein: 7.6 g/dL (ref 6.5–8.1)

## 2016-07-21 MED ORDER — SODIUM CHLORIDE 0.9 % IV BOLUS (SEPSIS)
500.0000 mL | Freq: Once | INTRAVENOUS | Status: AC
  Administered 2016-07-21: 500 mL via INTRAVENOUS

## 2016-07-21 MED ORDER — KETOROLAC TROMETHAMINE 30 MG/ML IJ SOLN
30.0000 mg | Freq: Once | INTRAMUSCULAR | Status: AC
  Administered 2016-07-21: 30 mg via INTRAVENOUS
  Filled 2016-07-21: qty 1

## 2016-07-21 NOTE — ED Triage Notes (Signed)
Pt reports left flank pain. Pt had kidney stones back in August and her symptoms are similar to the last time.  Pt took some of her left over medications from August with little relief.  Pt took tramadol and zofran.  Pt a/o x 4 and ambulatory.

## 2016-07-21 NOTE — Discharge Instructions (Signed)
Take home pain medication and tamsulosin as needed. Follow up with your urologist for re-evaluation. Return to the ED if you experience severe worsening of your symptoms, fever, abdominal pain.

## 2016-07-21 NOTE — ED Provider Notes (Signed)
[facility]-EMERGENCY DEPT Provider Note   CSN: [id number] Arrival date & time: 07/21/16  1736     History   Chief Complaint Chief Complaint  Patient presents with  . Flank Pain    HPI Margaret Johnston is a 60 y.o. female with a past medical history of H LD, nephrolithiasis, hypothyroidism since that you today complaining of left-sided flank pain. Patient states when she woke up this morning she was experiencing diffuse myalgias and fatigue. She laid back down for a nap. When she woke up she began experiencing sharp left-sided flank pain that felt like her previous kidney stones. She feels like "the stone is moving" down into her left groin. Patient took a home tramadol and Flomax with mild relief of her symptoms. However, she states the pain is still there. He shouldn't has required multiple lithotripsies in the past. Her last stone was 3 months ago. She denies any dysuria, fevers, vomiting, urinary urgency or frequency, hematuria.  HPI  Past Medical History:  Diagnosis Date  . Alzheimer's disease with early onset   . Angina    has had chest pain at times. Last time approx 3 mo ago  . Chronic kidney disease    right ureteral stone  . Depression   . GERD (gastroesophageal reflux disease)    rarely takes Prilosec  . Hypercholesterolemia   . Hypothyroidism   . Renal stones     Patient Active Problem List   Diagnosis Date Noted  . 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 CATARACT EXTRACTION    . CERVICAL POLYPECTOMY      OB History    No data available       Home Medications    Prior to Admission medications   Medication Sig Start Date End Date Taking? Authorizing Provider  calcium carbonate (OS-CAL) 600 MG TABS Take 600 mg by mouth daily.    Historical Provider, MD  cetirizine (ZYRTEC) 10 MG tablet Take 10 mg by mouth daily as needed. allergies    Historical Provider, MD  donepezil (ARICEPT) 10 MG tablet Take  half-tablet for one month, then increase to 1 tablet daily. 02/15/16   [place] Chardonika K Patel, DO  fenofibrate 160 MG tablet  01/22/16   Historical Provider, MD  ibuprofen (ADVIL,MOTRIN) 800 MG tablet  10/20/15   Historical Provider, MD  levothyroxine (SYNTHROID, LEVOTHROID) 100 MCG tablet Take 100 mcg by mouth daily before breakfast.     Historical Provider, MD  mometasone (NASONEX) 50 MCG/ACT nasal spray Place into the nose.    Historical Provider, MD  montelukast (SINGULAIR) 10 MG tablet  12/28/15   Historical Provider, MD  Multiple Vitamin (MULTIVITAMIN WITH MINERALS) TABS Take 1 tablet by mouth daily.    Historical Provider, MD  omeprazole (PRILOSEC) 40 MG capsule  08/31/15   Historical Provider, MD  Tamsulosin HCl (FLOMAX) 0.4 MG CAPS Take 0.4 mg by mouth daily.    Historical Provider, MD  Vitamin D, Ergocalciferol, (DRISDOL) 50000 units CAPS capsule  12/02/15   Historical Provider, MD    Family History Family History  Problem Relation Age of Onset  . Mental illness Mother   . Diabetes Mother   . Heart disease Father     Living  . Kidney disease Father   . Cancer Maternal Grandmother   . Alzheimer's disease Paternal Grandmother   . Multiple sclerosis Sister   . Headache Sister   . Healthy Daughter   . Hypothyroidism Daughter  Social History Social History  Substance Use Topics  . Smoking status: Never Smoker  . Smokeless tobacco: Never Used  . Alcohol use 0.0 oz/week     Comment: rarely     Allergies   Hydrocodone-acetaminophen; Hydromorphone; and Oxycodone   Review of Systems Review of Systems  All other systems reviewed and are negative.    Physical Exam Updated Vital Signs BP 136/78   Pulse 62   Temp 97.9 F (36.6 C) (Oral)   Resp 16   SpO2 99%   Physical Exam  Constitutional: She is oriented to person, place, and time. She appears well-developed and well-nourished. No distress.  HENT:  Head: Normocephalic and atraumatic.  Mouth/Throat: No oropharyngeal  exudate.  Eyes: Conjunctivae and EOM are normal. Pupils are equal, round, and reactive to light. Right eye exhibits no discharge. Left eye exhibits no discharge. No scleral icterus.  Cardiovascular: Normal rate, regular rhythm, normal heart sounds and intact distal pulses.  Exam reveals no gallop and no friction rub.   No murmur heard. Pulmonary/Chest: Effort normal and breath sounds normal. No respiratory distress. She has no wheezes. She has no rales. She exhibits no tenderness.  Abdominal: Soft. Bowel sounds are normal. She exhibits no distension. There is no tenderness. There is no guarding.  Mild left CVA tenderness  Musculoskeletal: Normal range of motion. She exhibits no edema.  Neurological: She is alert and oriented to person, place, and time.  Skin: Skin is warm and dry. No rash noted. She is not diaphoretic. No erythema. No pallor.  Psychiatric: She has a normal mood and affect. Her behavior is normal.  Nursing note and vitals reviewed.    ED Treatments / Results  Labs (all labs ordered are listed, but only abnormal results are displayed) Labs Reviewed  URINALYSIS, ROUTINE W REFLEX MICROSCOPIC (NOT AT [facility]) - Abnormal; Notable for the following:       Result Value   Hgb urine dipstick LARGE (*)    All other components within normal limits  COMPREHENSIVE METABOLIC PANEL - Abnormal; Notable for the following:    Glucose, Bld 110 (*)    Creatinine, Ser 1.04 (*)    GFR calc non Af Amer 56 (*)    All other components within normal limits  CBC WITH DIFFERENTIAL/PLATELET - Abnormal; Notable for the following:    WBC 10.8 (*)    Monocytes Absolute 1.4 (*)    All other components within normal limits  URINE MICROSCOPIC-ADD ON - Abnormal; Notable for the following:    Squamous Epithelial / LPF 0-5 (*)    Crystals CA OXALATE CRYSTALS (*)    All other components within normal limits  I-STAT CHEM 8, ED - Abnormal; Notable for the following:    BUN 26 (*)    Glucose, Bld 106 (*)      All other components within normal limits    EKG  EKG Interpretation None       Radiology No results found.  Procedures Procedures (including critical care time)  Medications Ordered in ED Medications  sodium chloride 0.9 % bolus 500 mL (500 mLs Intravenous New Bag/Given 07/21/16 1858)     Initial Impression / Assessment and Plan / ED Course  I have reviewed the triage vital signs and the nursing notes.  Pertinent labs & imaging results that were available during my care of the patient were reviewed by me and considered in my medical decision making (see chart for details).  Clinical Course     60 year old female  with history of nephrolithiasis presents to the ED today complaining of left-sided flank pain. Patient states this feels previous kidney stones. On presentation, patient overall appears well. She is resting comfortably in bed. Vitals are stable. Patient has required multiple lithotripsies in the past so CT renal stone study was ordered to further evaluate. CT reveals mild left-sided hydronephrosis with an obstructing 5 mm stone noted in the distal left ureter. Creatinine at baseline at 1.04. UA does not appear to be infected. All other lab work unremarkable. Pain is adequately controlled with Toradol. She has home Flomax and pain medication that she may take as needed. Patient has close follow-up with urology. I urged her to make an appointment for reevaluation. Return precautions outlined in patient discharge instructions.  Final Clinical Impressions(s) / ED Diagnoses   Final diagnoses:  Flank pain  Ureterolithiasis    New Prescriptions New Prescriptions   No medications on file     Dub Mikes, PA-C 07/24/16 1544    Lorre Nick, MD 07/25/16 2342

## 2016-07-21 NOTE — ED Notes (Signed)
Unable to collect labs patient in the bathroom

## 2016-07-24 DIAGNOSIS — N201 Calculus of ureter: Secondary | ICD-10-CM | POA: Diagnosis not present

## 2016-08-05 DIAGNOSIS — E039 Hypothyroidism, unspecified: Secondary | ICD-10-CM | POA: Diagnosis not present

## 2016-08-07 DIAGNOSIS — M79673 Pain in unspecified foot: Secondary | ICD-10-CM | POA: Diagnosis not present

## 2016-08-07 DIAGNOSIS — E039 Hypothyroidism, unspecified: Secondary | ICD-10-CM | POA: Diagnosis not present

## 2016-08-07 DIAGNOSIS — N200 Calculus of kidney: Secondary | ICD-10-CM | POA: Diagnosis not present

## 2016-08-07 DIAGNOSIS — Z6823 Body mass index (BMI) 23.0-23.9, adult: Secondary | ICD-10-CM | POA: Diagnosis not present

## 2016-08-14 DIAGNOSIS — N201 Calculus of ureter: Secondary | ICD-10-CM | POA: Diagnosis not present

## 2016-08-21 ENCOUNTER — Ambulatory Visit: Payer: BLUE CROSS/BLUE SHIELD | Admitting: Neurology

## 2016-09-05 DIAGNOSIS — J329 Chronic sinusitis, unspecified: Secondary | ICD-10-CM | POA: Diagnosis not present

## 2016-09-05 DIAGNOSIS — Z6823 Body mass index (BMI) 23.0-23.9, adult: Secondary | ICD-10-CM | POA: Diagnosis not present

## 2016-09-14 ENCOUNTER — Other Ambulatory Visit: Payer: Self-pay | Admitting: Neurology

## 2016-09-20 ENCOUNTER — Ambulatory Visit: Payer: BLUE CROSS/BLUE SHIELD | Admitting: Neurology

## 2016-09-24 DIAGNOSIS — E039 Hypothyroidism, unspecified: Secondary | ICD-10-CM | POA: Diagnosis not present

## 2016-09-27 DIAGNOSIS — R413 Other amnesia: Secondary | ICD-10-CM | POA: Diagnosis not present

## 2016-09-27 DIAGNOSIS — E039 Hypothyroidism, unspecified: Secondary | ICD-10-CM | POA: Diagnosis not present

## 2016-09-27 DIAGNOSIS — Z23 Encounter for immunization: Secondary | ICD-10-CM | POA: Diagnosis not present

## 2016-09-27 DIAGNOSIS — Z6823 Body mass index (BMI) 23.0-23.9, adult: Secondary | ICD-10-CM | POA: Diagnosis not present

## 2016-10-25 DIAGNOSIS — N201 Calculus of ureter: Secondary | ICD-10-CM | POA: Diagnosis not present

## 2016-10-25 DIAGNOSIS — N1330 Unspecified hydronephrosis: Secondary | ICD-10-CM | POA: Diagnosis not present

## 2016-11-25 DIAGNOSIS — E039 Hypothyroidism, unspecified: Secondary | ICD-10-CM | POA: Diagnosis not present

## 2016-11-25 DIAGNOSIS — R5383 Other fatigue: Secondary | ICD-10-CM | POA: Diagnosis not present

## 2016-11-28 DIAGNOSIS — R413 Other amnesia: Secondary | ICD-10-CM | POA: Diagnosis not present

## 2016-11-28 DIAGNOSIS — E039 Hypothyroidism, unspecified: Secondary | ICD-10-CM | POA: Diagnosis not present

## 2016-11-28 DIAGNOSIS — J309 Allergic rhinitis, unspecified: Secondary | ICD-10-CM | POA: Diagnosis not present

## 2016-11-28 DIAGNOSIS — M79673 Pain in unspecified foot: Secondary | ICD-10-CM | POA: Diagnosis not present

## 2016-12-02 ENCOUNTER — Encounter: Payer: Self-pay | Admitting: Neurology

## 2016-12-26 ENCOUNTER — Ambulatory Visit (INDEPENDENT_AMBULATORY_CARE_PROVIDER_SITE_OTHER): Payer: BLUE CROSS/BLUE SHIELD | Admitting: Neurology

## 2016-12-26 ENCOUNTER — Encounter: Payer: Self-pay | Admitting: Neurology

## 2016-12-26 VITALS — BP 110/80 | HR 74 | Ht 66.0 in | Wt 146.6 lb

## 2016-12-26 DIAGNOSIS — G300 Alzheimer's disease with early onset: Secondary | ICD-10-CM | POA: Diagnosis not present

## 2016-12-26 DIAGNOSIS — F0280 Dementia in other diseases classified elsewhere without behavioral disturbance: Secondary | ICD-10-CM | POA: Diagnosis not present

## 2016-12-26 DIAGNOSIS — G319 Degenerative disease of nervous system, unspecified: Secondary | ICD-10-CM | POA: Diagnosis not present

## 2016-12-26 NOTE — Progress Notes (Signed)
Follow-up Visit   Date: 12/26/16     Margaret Johnston MRN: [id number] DOB: 18-Jun-1956   Interim History: Margaret Johnston is a 61 y.o. 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  The patient was accompanied to the clinic by husband.  History of present illness: Her husband retired in August 2015 because of newly diagnosed neuroendocrine pancreatic cancer. Around this time, she began noticing that that she could not always remember the date. She had been managing finances for a number of years but in 2016, she delegated the responsibilities to her husband because of difficulty with numbers. She would often make errors such as saying "100" instead of "1000". She has restricted her driving to local distances only. She has not been involved in any MVAs. She has interests such as sewing and crochet and volunteers for the church. Sleep is good. Mood is good. Her husband states that there is some anxiety because of his medical problems since he is being monitored every 3 months for recurrence. She does all the cleaning and cooking at home. She has seven grandchildren, all in [place], except for one in [place] so they travel often.   UPDATE 02/15/2016:  She underwent neuropsychological testing which was most consistent with posterior cortical atrophy and presents with her husband as follow-up.  They have been researching the disease and have many questions.  They are very concerned about how to tell their children the diagnosis and sough the advice of a psychologist.  She had not noticed any significant changes since her last visit.  She endorses mild depression.  UPDATE 07/02/2016:   Since is tolerating Aricept 28m well and feels more alert taking the medication.  Her husband has noticed that she is talking in her sleep more and she endorses more vivid dreams.  They have  started on Turmeric and vitamin D supplements.  They have many questions regarding cognitive supplements.  She was started on social security benefits due to her dementia and are requesting a letter for disability benefits.  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  She is exercising daily, getting up to 5,000-10,000 steps/daily and has lost 10lb.  She enjoys scrabble online.    UPDATE 12/26/2016:  She is here for 634-monthollow-up appointment.  She and her husband feel that she is stable and she is tolerating Aricept 1088maily.  She continues to have vivid dreams and sleep talks, but this is not bothersome.  She is no longer driving because of visuospatial deficits, and this bothers her because she is always relying on her husband.  She tends to have cognitive and physician fatigue more with activities, such as weekend trips.  She has been reading and knitting, but takes more effort.    Medications:  Current Outpatient Prescriptions on File Prior to Visit  Medication Sig  . calcium carbonate (OS-CAL) 600 MG TABS Take 600 mg by mouth daily.  . cetirizine (ZYRTEC) 10 MG tablet Take 10 mg by mouth daily as needed. allergies  . Cholecalciferol (VITAMIN D3) 50000 units TABS Take 1 tablet by mouth daily.  . dMarland Kitchennepezil (ARICEPT) 10 MG tablet TAKE ONE-HALF TABLET BY MOUTH DAILY FOR 1 MONTH THEN INCREASE TAKE ONE TABLET BY MOUTH DAILY  . fenofibrate 160 MG tablet Take 160 mg by mouth  daily.   . fluticasone (FLONASE) 50 MCG/ACT nasal spray Place 1 spray into both nostrils daily.  Marland Kitchen ibuprofen (ADVIL,MOTRIN) 200 MG tablet Take 200 mg by mouth every 6 (six) hours as needed for moderate pain.  Marland Kitchen ibuprofen (ADVIL,MOTRIN) 800 MG tablet Take 800 mg by mouth every 8 (eight) hours as needed for moderate pain.   . mometasone (NASONEX) 50 MCG/ACT nasal spray Place into the nose.  . montelukast  (SINGULAIR) 10 MG tablet Take 10 mg by mouth at bedtime.   . Multiple Vitamin (MULTIVITAMIN WITH MINERALS) TABS Take 1 tablet by mouth daily.  Marland Kitchen omeprazole (PRILOSEC) 40 MG capsule   . ondansetron (ZOFRAN-ODT) 4 MG disintegrating tablet Take 4 mg by mouth every 8 (eight) hours as needed for nausea or vomiting.  Marland Kitchen oxyCODONE-acetaminophen (PERCOCET/ROXICET) 5-325 MG tablet Take 1 tablet by mouth every 8 (eight) hours as needed for moderate pain or severe pain.  . Tamsulosin HCl (FLOMAX) 0.4 MG CAPS Take 0.4 mg by mouth daily.  . traMADol (ULTRAM) 50 MG tablet Take 50 mg by mouth every 4 (four) hours as needed for moderate pain or severe pain.  . TURMERIC PO Take 1 capsule by mouth daily.  . Vitamin D, Ergocalciferol, (DRISDOL) 50000 units CAPS capsule    No current facility-administered medications on file prior to visit.    Allergies:  Allergies  Allergen Reactions  . Hydrocodone-Acetaminophen Other (See Comments)    Other  . Hydromorphone Itching  . Oxycodone Itching    Review of Systems:  CONSTITUTIONAL: No fevers, chills, night sweats, + intentional weight loss.  EYES: No visual changes or eye pain ENT: No hearing changes.  No history of nose bleeds.   RESPIRATORY: No cough, wheezing and shortness of breath.   CARDIOVASCULAR: Negative for chest pain, and palpitations.   GI: Negative for abdominal discomfort, blood in stools or black stools.  No recent change in bowel habits.   GU:  No history of incontinence.   MUSCLOSKELETAL: No history of joint pain or swelling.  No myalgias.   SKIN: Negative for lesions, rash, and itching.   ENDOCRINE: Negative for cold or heat intolerance, polydipsia or goiter.   PSYCH:  + depression or anxiety symptoms.   NEURO: As Above.   Vital Signs:  Vitals:   12/26/16 1511  BP: 110/80  Pulse: 74  SpO2: 98%  Weight: 146 lb 9 oz (66.5 kg)  Height: '5\' 6"'  (1.676 m)   Neurological Exam: MENTAL STATUS including orientation to time, place, person,  recent and remote memory, attention span and concentration, language, and fund of knowledge is fair.  Blunted affect.  Engages in conversation appropriately.  CRANIAL NERVES: Pupils are round and reactive to light. Extraocular muscles are intact. Visual fields are intact to confrontation.  Right ptosis (old). Face is symmetric. Palate elevates symmetrically. Tongue is midline.  MOTOR:  Motor strength is 5/5 in all extremities  REFLEXES:  Symmetric 2+/4 reflexes throughout.   SENSATION:  Vibration intact throughout  COORDINATION/GAIT:   Gait narrow based and stable.   Data: Labs 12/07/2015:  Vitamin B12 510, vitamin E 16.7  MRI brain 12/12/2015:  Mild global atrophy most notable involving parietal lobes.  Partially calcified pineal gland with minimal impression upon the superior colliculus may represent an incidental finding. Follow-up as noted above.  Superficial aspect of the posterior portion of the right parotid gland nonspecific T2 bright slightly heterogeneous 1.6 x 1 x 1.4 cm lesion. ENT consultation recommended for further delineation.  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 Results of this evaluation clearly are abnormal. There is nothing to suggest poor effort or feigning of symptoms. Furthermore, it appears that her cognitive deficits are beginning to interfere with her ability to manage complex tasks, such as finances and driving. As such, diagnostic criteria for a dementia syndrome are met.   The patient's cognitive profile is reflective of significant visual-spatial processing deficits with relatively preserved language and attention skills. 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 While the underlying  cause of PCA is unknown in this patient's case, PCA is attributable to Alzheimer's disease in most patients and therefore is often considered an atypical variant of AD.   Recommendations: 1. Education regarding mild dementia and PCA was provided to the patient and her husband. Written materials were provided. Emotional support was provided. 2. The patient will require assistance with complex ADLs (especially those involving visual-spatial processing, such as driving and finances). I recommended that she stops driving at this point, based on her performance on all visual-spatial tasks and on a cognitive test highly correlated with driving ability. She and her husband understand and agree with this. 3. Cholinesterase inhibitor therapies have not been well researched in patients with PCA. However, this could be considered. The patient will discuss this with Dr. Posey Pronto. 4. Psychotropic treatment of anxiety is requested by the patient and appears warranted. I would recommend an SSRI (e.g., Lexapro, Zoloft). 5. Neuropsychological evaluation in 9 months is indicated in order to monitor cognitive status, track progression of symptoms and further assist with treatment planning.  IMPRESSION/PLAN: Posterior cortical atrophy, variant of Alzheimer's dementia.  Clinically stable without signs of worsening, she is no longer driving or working due to cognitive impairment  - It has been ~one year since having her last neuropsychological assessment.  Repeat testing was declined  - Continue Aricept 67m daily  - Continue daily exercise and Mediterranean diet, praised her for staying active  Return to clinic in 6 months  The duration of this appointment visit was 25 minutes of face-to-face time with the patient.  Greater than 50% of this time was spent in counseling, explanation of diagnosis, planning of further management, and coordination of care.   Thank you for allowing me to participate in patient's care.  If I  can answer any additional questions, I would be pleased to do so.    Sincerely,    [place] K. PPosey Pronto DO

## 2016-12-26 NOTE — Patient Instructions (Signed)
Keep up the great work!  Return to clinic in 6 months

## 2016-12-27 ENCOUNTER — Ambulatory Visit: Payer: BLUE CROSS/BLUE SHIELD | Admitting: Neurology

## 2017-02-22 ENCOUNTER — Other Ambulatory Visit: Payer: Self-pay | Admitting: Neurology

## 2017-02-24 ENCOUNTER — Other Ambulatory Visit: Payer: Self-pay | Admitting: *Deleted

## 2017-02-24 MED ORDER — DONEPEZIL HCL 10 MG PO TABS: ORAL_TABLET | ORAL | 5 refills | Status: DC

## 2017-03-05 DIAGNOSIS — R1313 Dysphagia, pharyngeal phase: Secondary | ICD-10-CM | POA: Diagnosis not present

## 2017-04-01 IMAGING — CT CT HEAD W/O CM
2 series · 16 of 30 positions shown, 20 images · non-contrast
Comparison: None.

CLINICAL DATA: Short-term memory loss and forgetfulness, worsening
over the last 18 months.

EXAM:
CT HEAD WITHOUT CONTRAST
TECHNIQUE: Contiguous axial images were obtained from the base of the skull
through the vertex without intravenous contrast.

[Series 3: head bone · axial · 0.49mm/px · z∈[+17,+58]mm · 3 of 28 slices shown]
[im 2/28  bone]
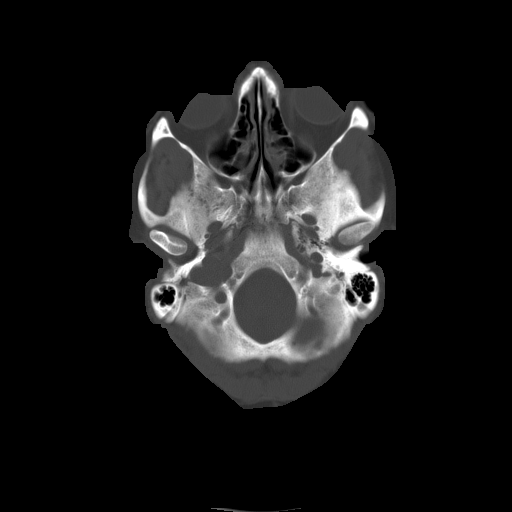
[im 6/28  bone]
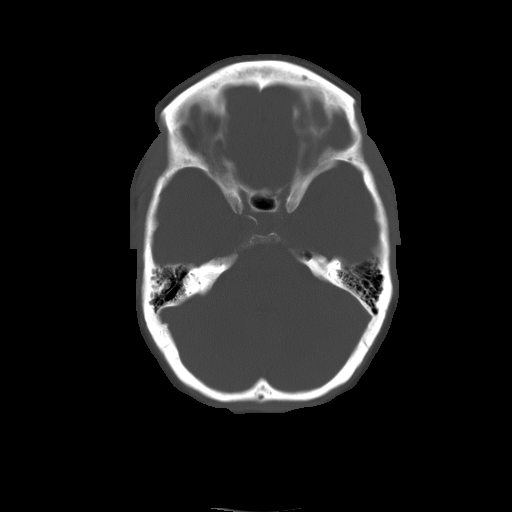
[im 10/28  bone]
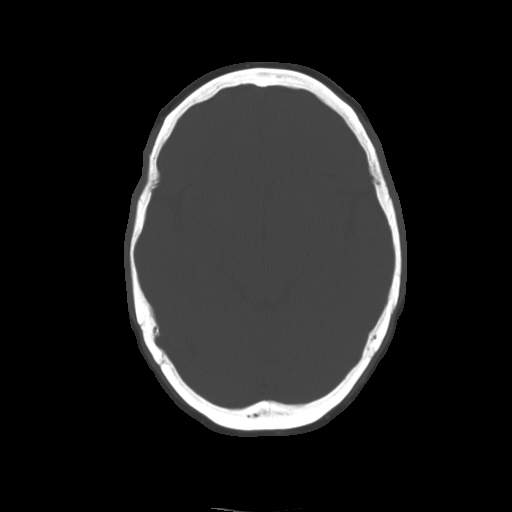

[Series 32: 3d filtered head w/o · axial · non-contrast · 0.49mm/px · z∈[+17,+141]mm · 13 of 28 slices shown, 17 images]
[im 2/28  brain]
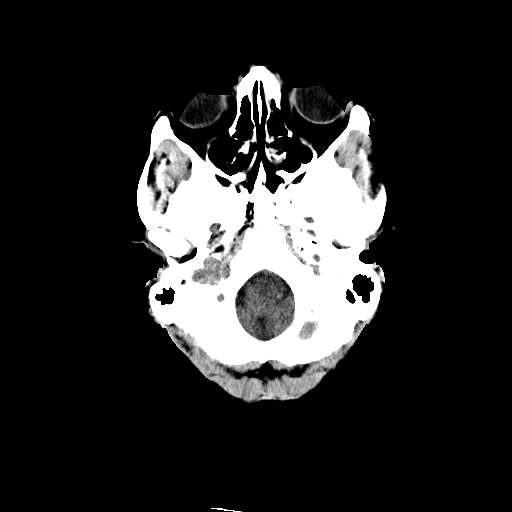
[im 2/28  bone]
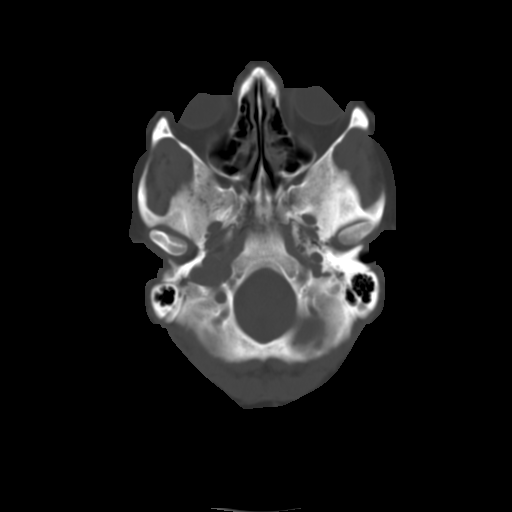
[im 4/28  brain]
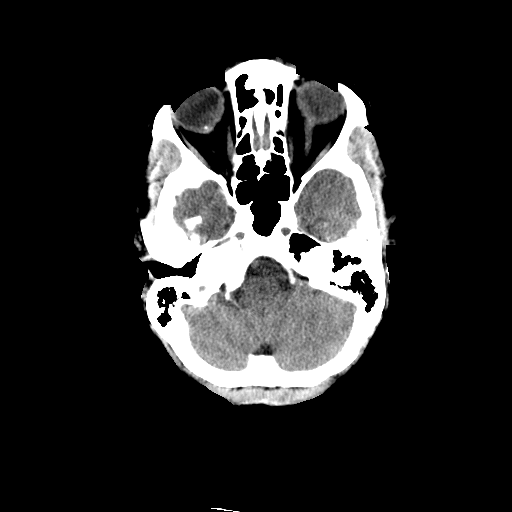
[im 6/28  brain]
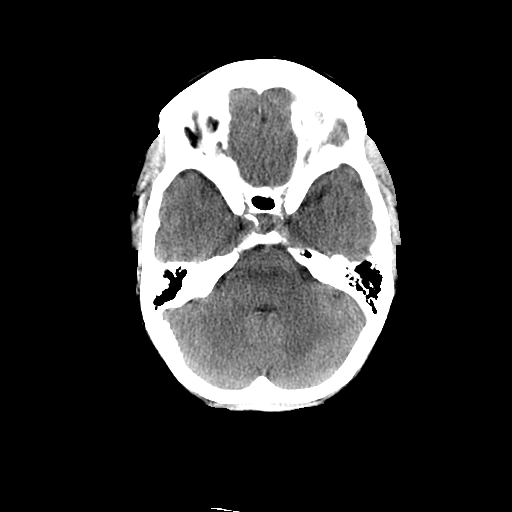
[im 8/28  brain]
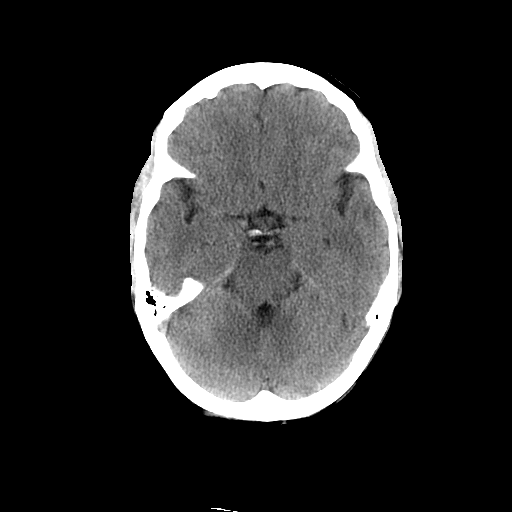
[im 10/28  brain]
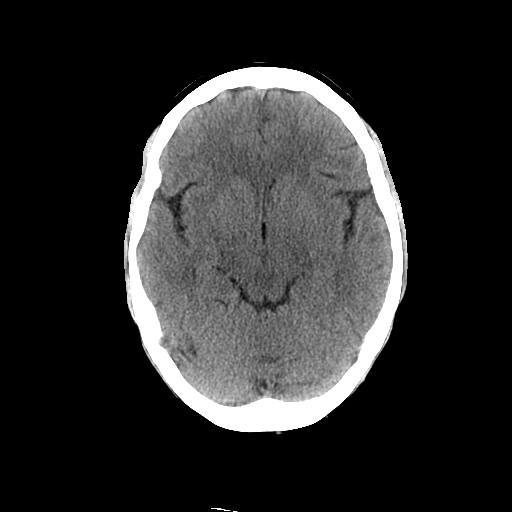
[im 10/28  bone]
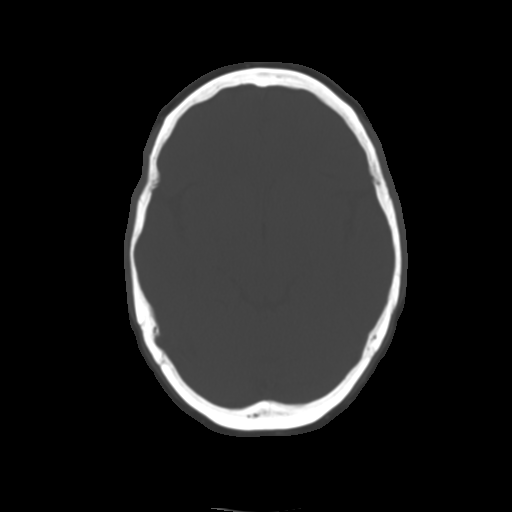
[im 12/28  brain]
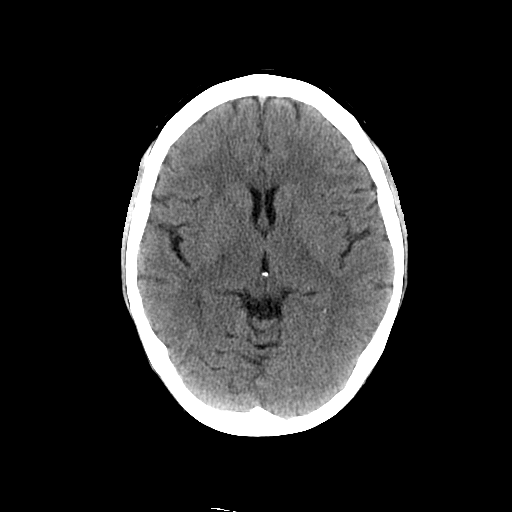
[im 14/28  brain]
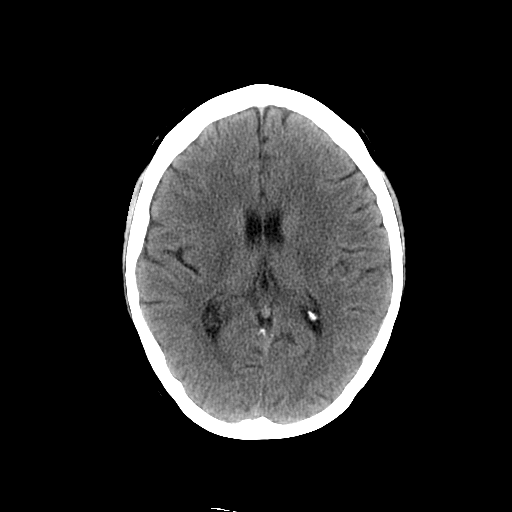
[im 16/28  brain]
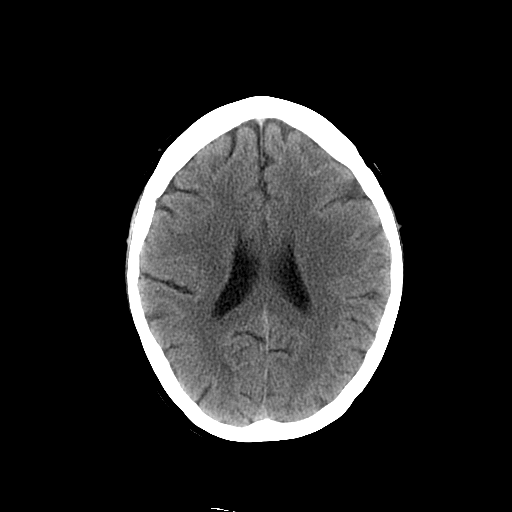
[im 18/28  brain]
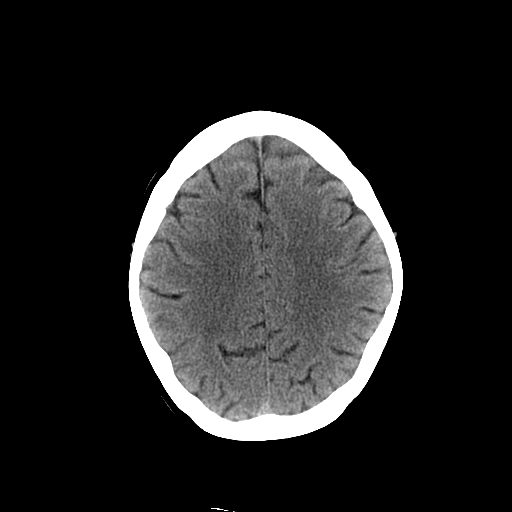
[im 18/28  bone]
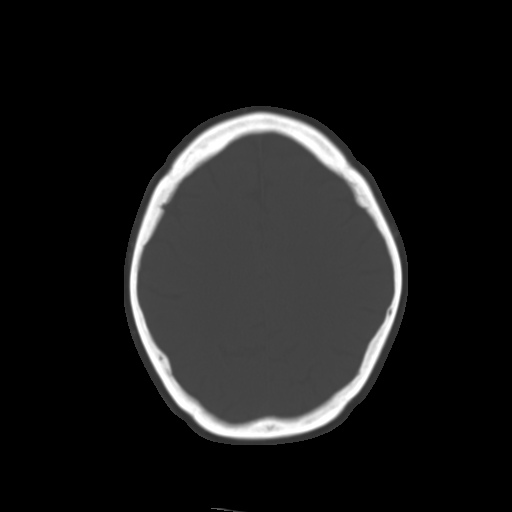
[im 20/28  brain]
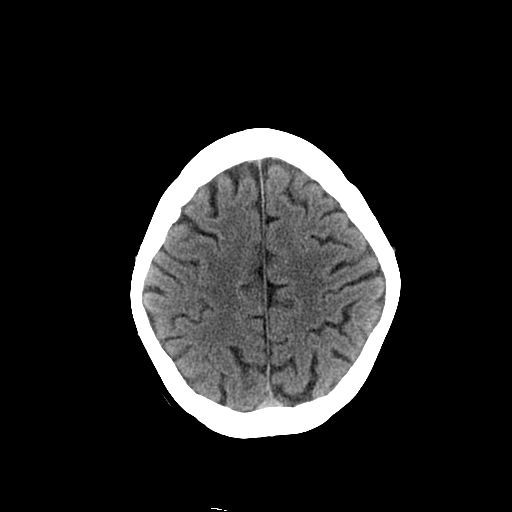
[im 22/28  brain]
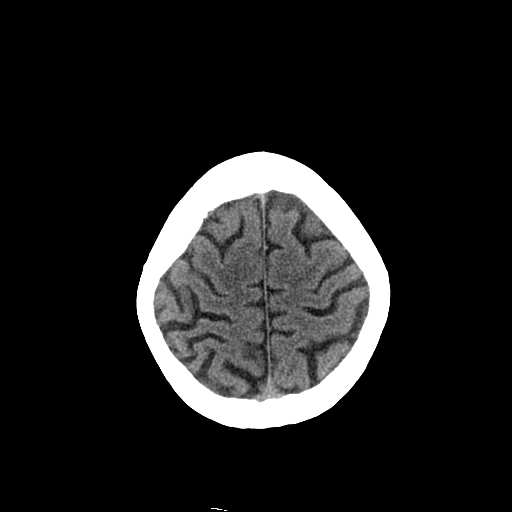
[im 24/28  brain]
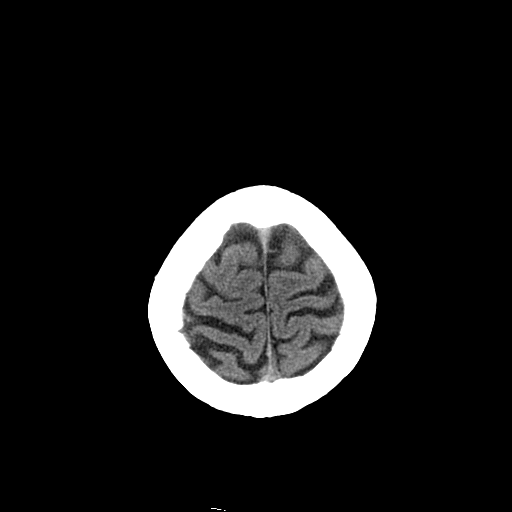
[im 26/28  brain]
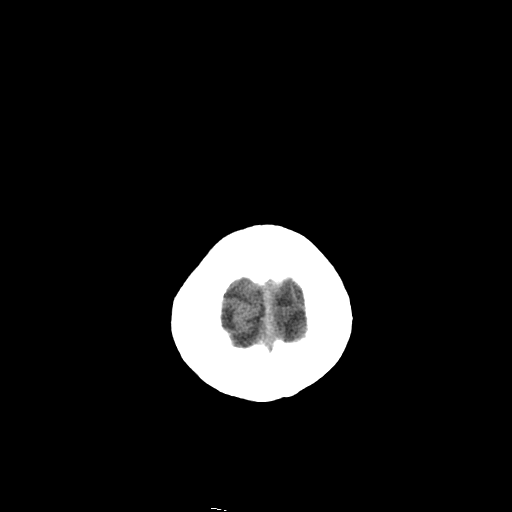
[im 26/28  bone]
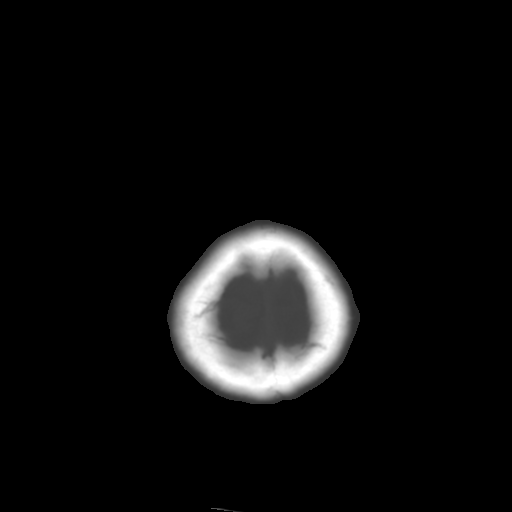

[16 of 30 positions shown; findings below may reference images not displayed]

FINDINGS: The brain has a normal appearance without evidence of malformation,
atrophy, old or acute infarction, mass lesion, hemorrhage,
hydrocephalus or extra-axial collection. The calvarium is
unremarkable. The paranasal sinuses, middle ears and mastoids are
clear.
IMPRESSION: Normal head CT

## 2017-05-05 DIAGNOSIS — Z114 Encounter for screening for human immunodeficiency virus [HIV]: Secondary | ICD-10-CM | POA: Diagnosis not present

## 2017-05-05 DIAGNOSIS — Z0000 Encounter for general adult medical examination without abnormal findings: Secondary | ICD-10-CM | POA: Diagnosis not present

## 2017-05-05 DIAGNOSIS — Z1329 Encounter for screening for other suspected endocrine disorder: Secondary | ICD-10-CM | POA: Diagnosis not present

## 2017-05-05 DIAGNOSIS — Z13220 Encounter for screening for lipoid disorders: Secondary | ICD-10-CM | POA: Diagnosis not present

## 2017-05-07 DIAGNOSIS — Z0000 Encounter for general adult medical examination without abnormal findings: Secondary | ICD-10-CM | POA: Diagnosis not present

## 2017-05-07 DIAGNOSIS — K219 Gastro-esophageal reflux disease without esophagitis: Secondary | ICD-10-CM | POA: Diagnosis not present

## 2017-05-07 DIAGNOSIS — E039 Hypothyroidism, unspecified: Secondary | ICD-10-CM | POA: Diagnosis not present

## 2017-05-07 DIAGNOSIS — E785 Hyperlipidemia, unspecified: Secondary | ICD-10-CM | POA: Diagnosis not present

## 2017-05-07 DIAGNOSIS — Z6823 Body mass index (BMI) 23.0-23.9, adult: Secondary | ICD-10-CM | POA: Diagnosis not present

## 2017-05-07 DIAGNOSIS — R413 Other amnesia: Secondary | ICD-10-CM | POA: Diagnosis not present

## 2017-05-21 DIAGNOSIS — Z1231 Encounter for screening mammogram for malignant neoplasm of breast: Secondary | ICD-10-CM | POA: Diagnosis not present

## 2017-05-21 DIAGNOSIS — Z803 Family history of malignant neoplasm of breast: Secondary | ICD-10-CM | POA: Diagnosis not present

## 2017-06-17 DIAGNOSIS — E039 Hypothyroidism, unspecified: Secondary | ICD-10-CM | POA: Diagnosis not present

## 2017-06-18 DIAGNOSIS — J309 Allergic rhinitis, unspecified: Secondary | ICD-10-CM | POA: Diagnosis not present

## 2017-06-18 DIAGNOSIS — E039 Hypothyroidism, unspecified: Secondary | ICD-10-CM | POA: Diagnosis not present

## 2017-06-18 DIAGNOSIS — Z23 Encounter for immunization: Secondary | ICD-10-CM | POA: Diagnosis not present

## 2017-06-18 DIAGNOSIS — Z6824 Body mass index (BMI) 24.0-24.9, adult: Secondary | ICD-10-CM | POA: Diagnosis not present

## 2017-06-18 DIAGNOSIS — K219 Gastro-esophageal reflux disease without esophagitis: Secondary | ICD-10-CM | POA: Diagnosis not present

## 2017-06-26 DIAGNOSIS — Z6823 Body mass index (BMI) 23.0-23.9, adult: Secondary | ICD-10-CM | POA: Diagnosis not present

## 2017-06-26 DIAGNOSIS — L299 Pruritus, unspecified: Secondary | ICD-10-CM | POA: Diagnosis not present

## 2017-06-26 DIAGNOSIS — Z01419 Encounter for gynecological examination (general) (routine) without abnormal findings: Secondary | ICD-10-CM | POA: Diagnosis not present

## 2017-07-02 ENCOUNTER — Ambulatory Visit: Payer: BLUE CROSS/BLUE SHIELD | Admitting: Neurology

## 2017-07-15 DIAGNOSIS — Z780 Asymptomatic menopausal state: Secondary | ICD-10-CM | POA: Diagnosis not present

## 2017-07-15 DIAGNOSIS — M8589 Other specified disorders of bone density and structure, multiple sites: Secondary | ICD-10-CM | POA: Diagnosis not present

## 2017-07-25 DIAGNOSIS — H524 Presbyopia: Secondary | ICD-10-CM | POA: Diagnosis not present

## 2017-07-25 DIAGNOSIS — Z961 Presence of intraocular lens: Secondary | ICD-10-CM | POA: Diagnosis not present

## 2017-08-20 DIAGNOSIS — H04123 Dry eye syndrome of bilateral lacrimal glands: Secondary | ICD-10-CM | POA: Diagnosis not present

## 2017-08-25 DIAGNOSIS — H04123 Dry eye syndrome of bilateral lacrimal glands: Secondary | ICD-10-CM | POA: Diagnosis not present

## 2017-09-20 ENCOUNTER — Other Ambulatory Visit: Payer: Self-pay | Admitting: Neurology

## 2017-09-24 ENCOUNTER — Ambulatory Visit: Payer: BLUE CROSS/BLUE SHIELD | Admitting: Neurology

## 2017-10-02 DIAGNOSIS — L738 Other specified follicular disorders: Secondary | ICD-10-CM | POA: Diagnosis not present

## 2017-10-02 DIAGNOSIS — D1801 Hemangioma of skin and subcutaneous tissue: Secondary | ICD-10-CM | POA: Diagnosis not present

## 2017-10-02 DIAGNOSIS — L821 Other seborrheic keratosis: Secondary | ICD-10-CM | POA: Diagnosis not present

## 2017-10-02 DIAGNOSIS — L814 Other melanin hyperpigmentation: Secondary | ICD-10-CM | POA: Diagnosis not present

## 2017-10-29 DIAGNOSIS — G319 Degenerative disease of nervous system, unspecified: Secondary | ICD-10-CM | POA: Diagnosis not present

## 2017-10-29 DIAGNOSIS — Z79899 Other long term (current) drug therapy: Secondary | ICD-10-CM | POA: Diagnosis not present

## 2017-11-07 ENCOUNTER — Other Ambulatory Visit: Payer: Self-pay | Admitting: Urology

## 2017-11-07 ENCOUNTER — Encounter (HOSPITAL_COMMUNITY): Payer: Self-pay | Admitting: *Deleted

## 2017-11-07 DIAGNOSIS — R109 Unspecified abdominal pain: Secondary | ICD-10-CM | POA: Diagnosis not present

## 2017-11-07 DIAGNOSIS — N201 Calculus of ureter: Secondary | ICD-10-CM | POA: Diagnosis not present

## 2017-11-07 NOTE — H&P (Signed)
Office Visit Report     11/07/2017   --------------------------------------------------------------------------------   Margaret Johnston  MRN: [id number]  PRIMARY CARE:  Maryelizabeth Rowan, MD  DOB: 1955-09-18, 62 year old Female  REFERRING:    SSN: [id number]  PROVIDER:  Marcine Matar, M.D.    LOCATION:  [facility] [phone]   --------------------------------------------------------------------------------   CC: I have ureteral stone.  HPI: Margaret Johnston is a 62 year-old female established patient who is here for ureteral stone.  The problem is on the right side. She first stated noticing pain on 11/07/2017. This is not her first kidney stone. She is currently having flank pain, groin pain, and nausea. She denies having back pain, vomiting, fever, and chills. Pain is occuring on the right side. She has not caught a stone in her urine strainer since her symptoms began.   Right flank and groin pain since this morning. No fever or chills, but she is experiencing nausea but no vomiting. No lower urinary tract symptoms.     ALLERGIES: HYDROmorphone HCl TABS - Itching    MEDICATIONS: Aricept 10 mg tablet  Calcium TABS Oral  Fenofibrate  Gabapentin  Levothyroxine Sodium 100 MCG Oral Tablet Oral  Singulair 10 mg tablet  Tumeric  Vitamin D3  Zantac     GU PSH: ESWL - 2013, 2013, 2012, 2012    NON-GU PSH: Hysteroscope Procedure    GU PMH: Ureteral calculus (Acute), Left distal ureteral stone, 5 mm in size. Symptomatically, this is probably right at the left UVJ. She is doing fairly well with this. - 07/24/2016, Mid Ureteral Stone On The Left, - 2014, Distal Ureteral Stone On The Left, - 2014, Mid Ureteral Stone On The Right, - 2014 Hydronephrosis Unspec, Hydronephrosis, right - 2014 Renal calculus, Nephrolithiasis - 2014 Other microscopic hematuria, Microscopic hematuria - 2014    NON-GU PMH: Alzheimer''s disease with early onset - 01/13/2016 Encounter for general  adult medical examination without abnormal findings, Encounter for preventive health examination - 2014 Personal history of other diseases of the musculoskeletal system and connective tissue, History of osteoporosis - 2014 Anxiety, Anxiety (Symptom) - 2014 Personal history of other diseases of the digestive system, History of esophageal reflux - 2014 Personal history of other endocrine, nutritional and metabolic disease, History of hypercholesterolemia - 2014, History of hypothyroidism, - 2014 Personal history of other mental and behavioral disorders, History of depression - 2014    FAMILY HISTORY: CABG - Father Chronic Interstitial Cystitis - Brother, Mother Coronary Artery Disease - Father Family Health Status Number - Runs In Family Hypertension - Runs In Family multiple sclerosis - Sister   SOCIAL HISTORY: Marital Status: Married Preferred Language: English; Ethnicity: Not Hispanic Or Latino; Race: White Current Smoking Status: Patient has never smoked.  Social Drinker.  Drinks 1 caffeinated drink per day. Patient's occupation is/was retired.    REVIEW OF SYSTEMS:    GU Review Female:   Patient denies frequent urination, hard to postpone urination, burning /pain with urination, get up at night to urinate, leakage of urine, stream starts and stops, trouble starting your stream, have to strain to urinate, and being pregnant.  Gastrointestinal (Upper):   Patient reports nausea and indigestion/ heartburn. Patient denies vomiting.  Gastrointestinal (Lower):   Patient denies diarrhea and constipation.  Constitutional:   Patient reports fatigue. Patient denies fever, night sweats, and weight loss.  Skin:   Patient denies skin rash/ lesion and itching.  Eyes:   Patient denies blurred vision and double vision.  Ears/ Nose/ Throat:   Patient denies sore throat and sinus problems.  Hematologic/Lymphatic:   Patient denies swollen glands and easy bruising.  Cardiovascular:   Patient denies leg  swelling and chest pains.  Respiratory:   Patient denies cough and shortness of breath.  Endocrine:   Patient denies excessive thirst.  Musculoskeletal:   Patient reports back pain. Patient denies joint pain.  Neurological:   Patient denies headaches and dizziness.  Psychologic:   Patient denies depression and anxiety.   VITAL SIGNS:      11/07/2017 01:27 PM  Weight 147 lb / 66.68 kg  Height 65 in / 165.1 cm  BP 126/81 mmHg  Pulse 56 /min  Temperature 97.6 F / 36.4 C  BMI 24.5 kg/m   MULTI-SYSTEM PHYSICAL EXAMINATION:    Constitutional: Well-nourished. No physical deformities. Normally developed. Good grooming. In obvious pain.  Neck: Neck symmetrical, not swollen. Normal tracheal position.  Respiratory: No labored breathing, no use of accessory muscles. Normal breath sounds.  Cardiovascular: Regular rate and rhythm. No murmur, no gallop. Normal temperature, normal extremity pulses, no swelling, no varicosities.  Lymphatic: No enlargement of neck, axillae, groin.  Skin: No paleness, no jaundice, no cyanosis. No lesion, no ulcer, no rash.  Neurologic / Psychiatric: Oriented to time, oriented to place, oriented to person. No depression, no anxiety, no agitation.  Gastrointestinal: No CVA tenderness. Mild right lower quadrant tenderness.  Eyes: Normal conjunctivae. Normal eyelids.  Ears, Nose, Mouth, and Throat: Left ear no scars, no lesions, no masses. Right ear no scars, no lesions, no masses. Nose no scars, no lesions, no masses. Normal hearing. Normal lips.  Musculoskeletal: Normal gait and station of head and neck.     PAST DATA REVIEWED:  Source Of History:  Patient  Urine Test Review:   Urinalysis  X-Ray Review: KUB: Reviewed Films. Discussed With Patient. the first I could not see a stone, bto be a faintly calcified scan, there is appears to be a faintly calcified stone at L4/L5 interspace on the right. C.T. Stone Protocol: Reviewed Films. Discussed With Patient. 5-6 mm stone  at the L4/L5 interspace on the right. Hounsfield units approximately 630, skin to stone distance 11 cm.    PROCEDURES:         C.T. Urogram - [id number]               KUB - [id number]  A single view of the abdomen is obtained.               Urinalysis w/Scope Dipstick Dipstick Cont'd Micro  Color: Yellow Bilirubin: Neg WBC/hpf: NS (Not Seen)  Appearance: Clear Ketones: Neg RBC/hpf: 20 - 40/hpf  Specific Gravity: 1.025 Blood: 3+ Bacteria: Rare (0-9/hpf)  pH: <=5.0 Protein: Trace Cystals: Ca Oxalate  Glucose: Neg Urobilinogen: 0.2 Casts: NS (Not Seen)    Nitrites: Neg Trichomonas: Not Present    Leukocyte Esterase: Neg Mucous: Not Present      Epithelial Cells: 0 - 5/hpf      Yeast: NS (Not Seen)      Sperm: Not Present         Ketoralac 60mg  - 16109, [id number] Area was prepped with alcohol. Medication was injected. Pt tolerated well.   Qty: 60 Adm. By: Silas Flood  Unit: mg Lot No [id number]  Route: IM Exp. Date 10/08/2018  Freq: None Mfgr.:   Site: Right Buttock         Phenergan 25mg  - [id number], [id number] Qty: 25 Adm. By: Silas Flood  Unit: mg Lot No [id number]  Route: IM Exp. Date 01/06/2018  Freq: None Mfgr.:   Site: Left Buttock   ASSESSMENT:      ICD-10 Details  1 GU:   Ureteral calculus - N20.1 Right, Acute - 6 mm rt mid ureteral stone HU 630 SSD 11   PLAN:            Medications New Meds: Percocet 5 mg-325 mg tablet 1 tablet PO Q 4 H   #15  0 Refill(s)            Orders X-Rays: C.T. Stone Protocol Without Contrast  X-Ray Notes: History:  Hematuria: Yes/No  Patient to see MD after exam: Yes/No  Previous exam: CT / IVP/ US/ KUB/ None  When:  Where:  Diabetic: Yes/ No  BUN/ Creatinine:  Date of last BUN Creatinine:  Weight in pounds:  Allergy- IV Contrast: Yes/ No  Conflicting diabetic meds: Yes/ No  Diabetic Meds:  Prior Authorization #: [id number]              Schedule X-Rays: KUB  Return Visit/Planned Activity: Next Available  Appointment - Schedule Surgery          Document Letter(s):  Created for Patient: Clinical Summary         Notes:   1. Toradol/phenergan   2. This is a significant size stone in the midureter. I think she should plan on having this treated. We discussed shockwave lithotripsy which she has tolerated before. We will consider doing that on Monday.   3. Oxycodone was refilled. She has ondansetron at home   Cc: Dr. Duanne Guessewey        Next Appointment:      Next Appointment: 11/10/2017 08:45 AM    Appointment Type: Surgery     Location: [facility] [phone]    Provider: Jerilee FieldMatthew Sonna Lipsky, M.D.    Reason for Visit: RT ESWL      CARE TEAM: Marcine MatarStephen Dahlstedt, M.D. Beryle QuantAngel Bryant Melissa McGranor Alcario DroughtErica Moses Thayer JewJennifer Thomas Deborah Thore Brewstereressa Williams     ** Signed by Marcine MatarStephen Dahlstedt, M.D. on 11/07/17 at 3:41 PM (EST)**     The information contained in this medical record document is considered private and confidential patient information. This information can only be used for the medical diagnosis and/or medical services that are being provided by the patient's selected caregivers. This information can only be distributed outside of the patient's care if the patient agrees and signs waivers of authorization for this information to be sent to an outside source or route.

## 2017-11-10 ENCOUNTER — Other Ambulatory Visit: Payer: Self-pay

## 2017-11-10 ENCOUNTER — Encounter (HOSPITAL_COMMUNITY)
Admission: RE | Disposition: A | Discharge status: Home / Self Care | Payer: Self-pay | Source: Ambulatory Visit | Attending: Urology

## 2017-11-10 ENCOUNTER — Ambulatory Visit (HOSPITAL_COMMUNITY): Payer: BLUE CROSS/BLUE SHIELD

## 2017-11-10 ENCOUNTER — Encounter (HOSPITAL_COMMUNITY): Payer: Self-pay | Admitting: *Deleted

## 2017-11-10 ENCOUNTER — Ambulatory Visit (HOSPITAL_COMMUNITY)
Admission: RE | Admit: 2017-11-10 | Discharge: 2017-11-10 | Disposition: A | Discharge status: Home / Self Care | Payer: BLUE CROSS/BLUE SHIELD | Source: Ambulatory Visit | Attending: Urology | Admitting: Urology

## 2017-11-10 DIAGNOSIS — Z79890 Hormone replacement therapy (postmenopausal): Secondary | ICD-10-CM | POA: Diagnosis not present

## 2017-11-10 DIAGNOSIS — F0280 Dementia in other diseases classified elsewhere without behavioral disturbance: Secondary | ICD-10-CM | POA: Insufficient documentation

## 2017-11-10 DIAGNOSIS — G300 Alzheimer's disease with early onset: Secondary | ICD-10-CM | POA: Diagnosis not present

## 2017-11-10 DIAGNOSIS — E039 Hypothyroidism, unspecified: Secondary | ICD-10-CM | POA: Diagnosis not present

## 2017-11-10 DIAGNOSIS — N200 Calculus of kidney: Secondary | ICD-10-CM | POA: Diagnosis not present

## 2017-11-10 DIAGNOSIS — Z885 Allergy status to narcotic agent status: Secondary | ICD-10-CM | POA: Insufficient documentation

## 2017-11-10 DIAGNOSIS — N202 Calculus of kidney with calculus of ureter: Secondary | ICD-10-CM | POA: Insufficient documentation

## 2017-11-10 DIAGNOSIS — N201 Calculus of ureter: Secondary | ICD-10-CM

## 2017-11-10 DIAGNOSIS — Z79899 Other long term (current) drug therapy: Secondary | ICD-10-CM | POA: Diagnosis not present

## 2017-11-10 DIAGNOSIS — E7800 Pure hypercholesterolemia, unspecified: Secondary | ICD-10-CM | POA: Diagnosis not present

## 2017-11-10 HISTORY — PX: EXTRACORPOREAL SHOCK WAVE LITHOTRIPSY: SHX1557

## 2017-11-10 HISTORY — DX: Personal history of urinary calculi: Z87.442

## 2017-11-10 SURGERY — LITHOTRIPSY, ESWL
Anesthesia: LOCAL | Laterality: Right

## 2017-11-10 MED ORDER — SODIUM CHLORIDE 0.9 % IV SOLN
INTRAVENOUS | Status: DC
  Administered 2017-11-10: 08:00:00 via INTRAVENOUS

## 2017-11-10 MED ORDER — CIPROFLOXACIN HCL 500 MG PO TABS
500.0000 mg | ORAL_TABLET | ORAL | Status: AC
  Administered 2017-11-10: 500 mg via ORAL
  Filled 2017-11-10: qty 1

## 2017-11-10 MED ORDER — DIPHENHYDRAMINE HCL 25 MG PO CAPS
25.0000 mg | ORAL_CAPSULE | ORAL | Status: AC
  Administered 2017-11-10: 25 mg via ORAL
  Filled 2017-11-10: qty 1

## 2017-11-10 MED ORDER — DIAZEPAM 5 MG PO TABS
10.0000 mg | ORAL_TABLET | ORAL | Status: AC
  Administered 2017-11-10: 10 mg via ORAL
  Filled 2017-11-10: qty 2

## 2017-11-10 NOTE — Discharge Instructions (Signed)
Moderate Conscious Sedation, Adult, Care After °These instructions provide you with information about caring for yourself after your procedure. Your health care provider may also give you more specific instructions. Your treatment has been planned according to current medical practices, but problems sometimes occur. Call your health care provider if you have any problems or questions after your procedure. °What can I expect after the procedure? °After your procedure, it is common: °To feel sleepy for several hours. °To feel clumsy and have poor balance for several hours. °To have poor judgment for several hours. °To vomit if you eat too soon. ° °Follow these instructions at home: °For at least 24 hours after the procedure: ° °Do not: °Participate in activities where you could fall or become injured. °Drive. °Use heavy machinery. °Drink alcohol. °Take sleeping pills or medicines that cause drowsiness. °Make important decisions or sign legal documents. °Take care of children on your own. °Rest. °Eating and drinking °Follow the diet recommended by your health care provider. °If you vomit: °Drink water, juice, or soup when you can drink without vomiting. °Make sure you have little or no nausea before eating solid foods. °General instructions °Have a responsible adult stay with you until you are awake and alert. °Take over-the-counter and prescription medicines only as told by your health care provider. °If you smoke, do not smoke without supervision. °Keep all follow-up visits as told by your health care provider. This is important. °Contact a health care provider if: °You keep feeling nauseous or you keep vomiting. °You feel light-headed. °You develop a rash. °You have a fever. °Get help right away if: °You have trouble breathing. °This information is not intended to replace advice given to you by your health care provider. Make sure you discuss any questions you have with your health care provider. °Document Released:  06/16/2013 Document Revised: 01/29/2016 Document Reviewed: 12/16/2015 °Elsevier Interactive Patient Education © 2018 [employer] °Lithotripsy, Care After °This sheet gives you information about how to care for yourself after your procedure. Your health care provider may also give you more specific instructions. If you have problems or questions, contact your health care provider. °What can I expect after the procedure? °After the procedure, it is common to have: °· Some blood in your urine. This should only last for a few days. °· Soreness in your back, sides, or upper abdomen for a few days. °· Blotches or bruises on your back where the pressure wave entered the skin. °· Pain, discomfort, or nausea when pieces (fragments) of the kidney stone move through the tube that carries urine from the kidney to the bladder (ureter). Stone fragments may pass soon after the procedure, but they may continue to pass for up to 4-8 weeks. °? If you have severe pain or nausea, contact your health care provider. This may be caused by a large stone that was not broken up, and this may mean that you need more treatment. °· Some pain or discomfort during urination. °· Some pain or discomfort in the lower abdomen or (in men) at the base of the penis. ° °Follow these instructions at home: °Medicines °· Take over-the-counter and prescription medicines only as told by your health care provider. °· If you were prescribed an antibiotic medicine, take it as told by your health care provider. Do not stop taking the antibiotic even if you start to feel better. °· Do not drive for 24 hours if you were given a medicine to help you relax (sedative). °· Do not drive   or use heavy machinery while taking prescription pain medicine. °Eating and drinking °· Drink enough water and fluids to keep your urine clear or pale yellow. This helps any remaining pieces of the stone to pass. It can also help prevent new stones from forming. °· Eat plenty of fresh  fruits and vegetables. °· Follow instructions from your health care provider about eating and drinking restrictions. You may be instructed: °? To reduce how much salt (sodium) you eat or drink. Check ingredients and nutrition facts on packaged foods and beverages. °? To reduce how much meat you eat. °· Eat the recommended amount of calcium for your age and gender. Ask your health care provider how much calcium you should have. °General instructions °· Get plenty of rest. °· Most people can resume normal activities 1-2 days after the procedure. Ask your health care provider what activities are safe for you. °· If directed, strain all urine through the strainer that was provided by your health care provider. °? Keep all fragments for your health care provider to see. Any stones that are found may be sent to a medical lab for examination. The stone may be as small as a grain of salt. °· Keep all follow-up visits as told by your health care provider. This is important. °Contact a health care provider if: °· You have pain that is severe or does not get better with medicine. °· You have nausea that is severe or does not go away. °· You have blood in your urine longer than your health care provider told you to expect. °· You have more blood in your urine. °· You have pain during urination that does not go away. °· You urinate more frequently than usual and this does not go away. °· You develop a rash or any other possible signs of an allergic reaction. °Get help right away if: °· You have severe pain in your back, sides, or upper abdomen. °· You have severe pain while urinating. °· Your urine is very dark red. °· You have blood in your stool (feces). °· You cannot pass any urine at all. °· You feel a strong urge to urinate after emptying your bladder. °· You have a fever or chills. °· You develop shortness of breath, difficulty breathing, or chest pain. °· You have severe nausea that leads to persistent vomiting. °· You  faint. °Summary °· After this procedure, it is common to have some pain, discomfort, or nausea when pieces (fragments) of the kidney stone move through the tube that carries urine from the kidney to the bladder (ureter). If this pain or nausea is severe, however, you should contact your health care provider. °· Most people can resume normal activities 1-2 days after the procedure. Ask your health care provider what activities are safe for you. °· Drink enough water and fluids to keep your urine clear or pale yellow. This helps any remaining pieces of the stone to pass, and it can help prevent new stones from forming. °· If directed, strain your urine and keep all fragments for your health care provider to see. Fragments or stones may be as small as a grain of salt. °· Get help right away if you have severe pain in your back, sides, or upper abdomen or have severe pain while urinating. °This information is not intended to replace advice given to you by your health care provider. Make sure you discuss any questions you have with your health care provider. °Document Released: 09/15/2007 Document Revised:   07/17/2016 Document Reviewed: 07/17/2016 °Elsevier Interactive Patient Education © 2018 [employer] ° °

## 2017-11-10 NOTE — Op Note (Signed)
Right proximal to mid ureteral stone, 6 mm  Right ESWL  Findings: stone localized at L5 just above iliac crest in both planes on flouro and fragemented/faded well. She will likely do well, but may need a staged procedure if the fragments don't pass. She tolerated the procedure well.

## 2017-11-11 ENCOUNTER — Encounter (HOSPITAL_COMMUNITY): Payer: Self-pay | Admitting: Urology

## 2017-12-02 DIAGNOSIS — N201 Calculus of ureter: Secondary | ICD-10-CM | POA: Diagnosis not present

## 2017-12-22 ENCOUNTER — Ambulatory Visit: Payer: BLUE CROSS/BLUE SHIELD | Admitting: Neurology

## 2017-12-23 DIAGNOSIS — E559 Vitamin D deficiency, unspecified: Secondary | ICD-10-CM | POA: Diagnosis not present

## 2017-12-23 DIAGNOSIS — E039 Hypothyroidism, unspecified: Secondary | ICD-10-CM | POA: Diagnosis not present

## 2017-12-25 DIAGNOSIS — J309 Allergic rhinitis, unspecified: Secondary | ICD-10-CM | POA: Diagnosis not present

## 2017-12-25 DIAGNOSIS — E559 Vitamin D deficiency, unspecified: Secondary | ICD-10-CM | POA: Diagnosis not present

## 2017-12-25 DIAGNOSIS — R413 Other amnesia: Secondary | ICD-10-CM | POA: Diagnosis not present

## 2017-12-25 DIAGNOSIS — E039 Hypothyroidism, unspecified: Secondary | ICD-10-CM | POA: Diagnosis not present

## 2018-01-06 DIAGNOSIS — L0212 Furuncle of neck: Secondary | ICD-10-CM | POA: Diagnosis not present

## 2018-01-06 DIAGNOSIS — L0889 Other specified local infections of the skin and subcutaneous tissue: Secondary | ICD-10-CM | POA: Diagnosis not present

## 2018-01-06 DIAGNOSIS — L820 Inflamed seborrheic keratosis: Secondary | ICD-10-CM | POA: Diagnosis not present

## 2018-02-05 DIAGNOSIS — F4310 Post-traumatic stress disorder, unspecified: Secondary | ICD-10-CM | POA: Diagnosis not present

## 2018-02-19 DIAGNOSIS — Z1211 Encounter for screening for malignant neoplasm of colon: Secondary | ICD-10-CM | POA: Diagnosis not present

## 2018-02-19 DIAGNOSIS — K5730 Diverticulosis of large intestine without perforation or abscess without bleeding: Secondary | ICD-10-CM | POA: Diagnosis not present

## 2018-02-19 DIAGNOSIS — Z86010 Personal history of colonic polyps: Secondary | ICD-10-CM | POA: Diagnosis not present

## 2018-02-24 DIAGNOSIS — E039 Hypothyroidism, unspecified: Secondary | ICD-10-CM | POA: Diagnosis not present

## 2018-02-25 DIAGNOSIS — F4310 Post-traumatic stress disorder, unspecified: Secondary | ICD-10-CM | POA: Diagnosis not present

## 2018-02-25 DIAGNOSIS — G319 Degenerative disease of nervous system, unspecified: Secondary | ICD-10-CM | POA: Diagnosis not present

## 2018-02-26 DIAGNOSIS — R413 Other amnesia: Secondary | ICD-10-CM | POA: Diagnosis not present

## 2018-02-26 DIAGNOSIS — K219 Gastro-esophageal reflux disease without esophagitis: Secondary | ICD-10-CM | POA: Diagnosis not present

## 2018-02-26 DIAGNOSIS — E039 Hypothyroidism, unspecified: Secondary | ICD-10-CM | POA: Diagnosis not present

## 2018-02-26 DIAGNOSIS — Z6825 Body mass index (BMI) 25.0-25.9, adult: Secondary | ICD-10-CM | POA: Diagnosis not present

## 2018-03-04 DIAGNOSIS — N201 Calculus of ureter: Secondary | ICD-10-CM | POA: Diagnosis not present

## 2018-04-01 DIAGNOSIS — K219 Gastro-esophageal reflux disease without esophagitis: Secondary | ICD-10-CM | POA: Diagnosis not present

## 2018-04-01 DIAGNOSIS — R413 Other amnesia: Secondary | ICD-10-CM | POA: Diagnosis not present

## 2018-04-01 DIAGNOSIS — J309 Allergic rhinitis, unspecified: Secondary | ICD-10-CM | POA: Diagnosis not present

## 2018-04-01 DIAGNOSIS — Z6825 Body mass index (BMI) 25.0-25.9, adult: Secondary | ICD-10-CM | POA: Diagnosis not present

## 2018-04-09 DIAGNOSIS — F4323 Adjustment disorder with mixed anxiety and depressed mood: Secondary | ICD-10-CM | POA: Diagnosis not present

## 2018-04-09 DIAGNOSIS — G319 Degenerative disease of nervous system, unspecified: Secondary | ICD-10-CM | POA: Diagnosis not present

## 2018-04-21 DIAGNOSIS — H04123 Dry eye syndrome of bilateral lacrimal glands: Secondary | ICD-10-CM | POA: Diagnosis not present

## 2018-04-30 DIAGNOSIS — Z9189 Other specified personal risk factors, not elsewhere classified: Secondary | ICD-10-CM | POA: Diagnosis not present

## 2018-04-30 DIAGNOSIS — Z23 Encounter for immunization: Secondary | ICD-10-CM | POA: Diagnosis not present

## 2018-04-30 DIAGNOSIS — J309 Allergic rhinitis, unspecified: Secondary | ICD-10-CM | POA: Diagnosis not present

## 2018-04-30 DIAGNOSIS — Z6825 Body mass index (BMI) 25.0-25.9, adult: Secondary | ICD-10-CM | POA: Diagnosis not present

## 2018-04-30 DIAGNOSIS — K219 Gastro-esophageal reflux disease without esophagitis: Secondary | ICD-10-CM | POA: Diagnosis not present

## 2018-05-01 ENCOUNTER — Telehealth: Payer: Self-pay | Admitting: Neurology

## 2018-05-01 NOTE — Telephone Encounter (Signed)
Patient's husband called with concerns about her medication. He left a voicemail but I called back and got Margaret Johnston. She was needing to talk to BensleyAshley about her Amenda and Aricept medication. She was also taking some claritin allergy meds for her throat and it was making her sleepy. Please call them back at [phone]. Thanks.

## 2018-05-01 NOTE — Telephone Encounter (Signed)
I spoke with patient's husband and he said that patient has been put on Namenda bid [facility].  She has also started claritin.  Between the 2 meds, she is very sleepy and "dopey".  Her husband stopped the pm dose of namenda and the claritin and patient is doing better.  They want to know if they should try to go back on the claritin.  Please advise.

## 2018-05-04 NOTE — Telephone Encounter (Signed)
The side effects she is describing is most likely due to claritin.  She should stop this medication and can resume namenda.  Any of the allergy medications (especially benadryl) have the risk of increased sleepiness and memory changes due to their antihistamine mechanism of action.  She can try low dose Zyrtec or Allegra as other options for allergies, but they also have the potential for these side effects, so always start on low dose.   Shalece Staffa K. [name], DO

## 2018-05-04 NOTE — Telephone Encounter (Signed)
Patient's husband given information and instructions.

## 2018-05-07 ENCOUNTER — Other Ambulatory Visit: Payer: Self-pay | Admitting: Neurology

## 2018-05-22 ENCOUNTER — Ambulatory Visit (INDEPENDENT_AMBULATORY_CARE_PROVIDER_SITE_OTHER): Payer: BLUE CROSS/BLUE SHIELD | Admitting: Neurology

## 2018-05-22 ENCOUNTER — Encounter: Payer: Self-pay | Admitting: Neurology

## 2018-05-22 ENCOUNTER — Other Ambulatory Visit (INDEPENDENT_AMBULATORY_CARE_PROVIDER_SITE_OTHER): Payer: BLUE CROSS/BLUE SHIELD

## 2018-05-22 VITALS — BP 100/70 | HR 70 | Ht 66.0 in | Wt 151.4 lb

## 2018-05-22 DIAGNOSIS — R202 Paresthesia of skin: Secondary | ICD-10-CM

## 2018-05-22 DIAGNOSIS — F0280 Dementia in other diseases classified elsewhere without behavioral disturbance: Secondary | ICD-10-CM

## 2018-05-22 DIAGNOSIS — G300 Alzheimer's disease with early onset: Secondary | ICD-10-CM

## 2018-05-22 DIAGNOSIS — G319 Degenerative disease of nervous system, unspecified: Secondary | ICD-10-CM | POA: Diagnosis not present

## 2018-05-22 LAB — VITAMIN B12: Vitamin B-12: 339 pg/mL (ref 211–911)

## 2018-05-22 LAB — SEDIMENTATION RATE: SED RATE: 9 mm/h (ref 0–30)

## 2018-05-22 LAB — FOLATE: Folate: 12.4 ng/mL (ref >5.9)

## 2018-05-22 NOTE — Progress Notes (Signed)
Follow-up Visit   Date: 05/22/18     Kylan Veach MRN: [id number] DOB: 09-10-1955   Interim History: Goldia Ligman is a 62 y.o. right-handed Caucasian female with hypothyroidism, GERD, and history of kidney stones returning to the clinic for follow-up of posterior cortical atrophy and new complains of feet pain.  The patient was accompanied to the clinic by husband.  History of present illness: Her husband retired in August 2015 because of newly diagnosed neuroendocrine pancreatic cancer. Around this time, she began noticing that that she could not always remember the date. She had been managing finances for a number of years but in 2016, she delegated the responsibilities to her husband because of difficulty with numbers. She would often make errors such as saying "100" instead of "1000". She has restricted her driving to local distances only. She has not been involved in any MVAs. She has interests such as sewing and crochet and volunteers for the church. Sleep is good. Mood is good. Her husband states that there is some anxiety because of his medical problems since he is being monitored every 3 months for recurrence. She does all the cleaning and cooking at home. She has seven grandchildren, all in [place], except for one in [place] so they travel often.   UPDATE 02/15/2016:  Neuropsychological testing was most consistent with posterior cortical atrophy.  They have been researching the disease and have many questions.  They are very concerned about how to tell their children the diagnosis and sough the advice of a psychologist.  She had not noticed any significant changes since her last visit.    UPDATE 07/02/2016:   Since is tolerating Aricept 92m well and feels more alert taking the medication.  Her husband has noticed that she is talking in her sleep more and she has vivid dreams.  They have started on Turmeric and vitamin D supplements.  They have many questions regarding  cognitive supplements.  She was started on social security benefits due to her dementia and are requesting a letter for disability benefits.  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  She is exercising daily, getting up to 5,000-10,000 steps/daily and has lost 10lb.  She enjoys scrabble online.    UPDATE 12/26/2016:  She and her husband feel that she is stable and she is tolerating Aricept 127mdaily.  She continues to have vivid dreams and sleep talks, but this is not bothersome.  She is no longer driving because of visuospatial deficits, and this bothers her because she is always relying on her husband.  She tends to have cognitive and physician fatigue more with activities, such as weekend trips.  She has been reading and knitting, but takes more effort.    UPDATE 05/22/2018:  Over the past several years, she has noticed burning sensation over the feet, which often keeps her awake. She initially felt that it started following an ankle sprain in 2015.   Pain is intermittent and occurs about once per week. It is worse with prolonged activity.  She was given gabapentin 20014mt bedtime which she takes as needed for pain, which provides relief.  She feels sluggish in the morning when she takes the medication.  She denies imbalance or weakness.   With respect to her dementia, she is still able to do all ADLs.  She keeps up with household chores, except cooking  recipes can be challenging because she forgets when she already added ingredient in already.  She does laundry and cleans.  Her husband manages finances and medications.  She has become more sedentary and does not cross stitch anymore.  She is sleeping more during the night and taking frequent naps.    Medications:  Current Outpatient Medications on File Prior to Visit  Medication Sig  . Cholecalciferol (VITAMIN D3) 5000 units CAPS  Take 1 capsule by mouth daily.  Marland Kitchen donepezil (ARICEPT) 10 MG tablet TAKE ONE-HALF TABLET BY MOUTH DAILY X 30 DAYS THEN INCREASE TO TAKE ONE TABLET BY MOUTH DAILY  . fenofibrate 160 MG tablet Take 160 mg by mouth daily.   Marland Kitchen gabapentin (NEURONTIN) 100 MG capsule Take by mouth.  Marland Kitchen ibuprofen (ADVIL,MOTRIN) 200 MG tablet Take 200 mg by mouth every 6 (six) hours as needed for moderate pain.  Marland Kitchen levothyroxine (SYNTHROID, LEVOTHROID) 88 MCG tablet   . loratadine (CLARITIN) 10 MG tablet Take by mouth.  . memantine (NAMENDA) 10 MG tablet Take 1/2 pill qhs for 1 week, then 1/2 pill qam and qhs for 1 week, then 1/2 pill qam and 1 pill qhs for 1 week, then 1 pill bid thereafter  . montelukast (SINGULAIR) 10 MG tablet Take 10 mg by mouth at bedtime.   . Multiple Vitamin (MULTIVITAMIN WITH MINERALS) TABS Take 1 tablet by mouth daily.  . ranitidine (ZANTAC) 150 MG tablet Take 150 mg by mouth 2 (two) times daily.  . TURMERIC PO Take 1 capsule by mouth daily.   No current facility-administered medications on file prior to visit.    Allergies:  Allergies  Allergen Reactions  . Hydrocodone-Acetaminophen Other (See Comments)    Other  . Hydromorphone Itching  . Oxycodone Itching    Review of Systems:  CONSTITUTIONAL: No fevers, chills, night sweats, + intentional weight loss.  EYES: No visual changes or eye pain ENT: No hearing changes.  No history of nose bleeds.   RESPIRATORY: No cough, wheezing and shortness of breath.   CARDIOVASCULAR: Negative for chest pain, and palpitations.   GI: Negative for abdominal discomfort, blood in stools or black stools.  No recent change in bowel habits.   GU:  No history of incontinence.   MUSCLOSKELETAL: No history of joint pain or swelling.  No myalgias.   SKIN: Negative for lesions, rash, and itching.   ENDOCRINE: Negative for cold or heat intolerance, polydipsia or goiter.   PSYCH:  + depression or anxiety symptoms.   NEURO: As Above.   Vital Signs:  Vitals:     05/22/18 0826  BP: 100/70  Pulse: 70  SpO2: 98%  Weight: 151 lb 6 oz (68.7 kg)  Height: _0  (1.676 m)     General Medical Exam:   General:  Well appearing, comfortable  Eyes/ENT: see cranial nerve examination.   Neck: No masses appreciated.  Full range of motion without tenderness.  No carotid bruits. Respiratory:  Clear to auscultation, good air entry bilaterally.   Cardiac:  Regular rate and rhythm, no murmur.   Ext:  No edema  Neurological Exam: MENTAL STATUS including orientation to time, place, person, recent and remote memory, attention span and concentration, language, and fund of knowledge is fair.  Blunted affect.  Engages in conversation appropriately.  Montreal Cognitive Assessment  05/22/2018 12/07/2015  Visuospatial/ Executive (0/5) 1 3  Naming (0/3) 3 3  Attention: Read list of digits (0/2) 2 1  Attention: Read list of letters (0/1) 1 1  Attention: Serial 7 subtraction starting at 100 (0/3) 2 2  Language: Repeat phrase (0/2) 2 2  Language : Fluency (0/1) 1 1  Abstraction (0/2) 1 2  Delayed Recall (0/5) 0 0  Orientation (0/6) 4 5  Total 17 20  Adjusted Score (based on education) 17 20    CRANIAL NERVES: Pupils are round and reactive to light. Extraocular muscles are intact. Visual fields are intact to confrontation.  Right ptosis (old). Face is symmetric. Palate elevates symmetrically. Tongue is midline.  MOTOR:  Motor strength is 5/5 in all extremities  REFLEXES:  Symmetric and brisk 2+/4 reflexes throughout, except absent bilateral Achilles.   SENSATION:  Vibration reduced at the great toe, intact at the ankles.  Pin prick and temperature is intact throughout.   COORDINATION/GAIT:   Gait narrow based and stable. Stressed and tanden gait intact.   Data: Labs 12/07/2015:  Vitamin B12 510, vitamin E 16.7  MRI brain 12/12/2015:  Mild global atrophy most notable involving parietal lobes.  Partially calcified pineal gland with minimal impression upon the  superior colliculus may represent an incidental finding. Follow-up as noted above.  Superficial aspect of the posterior portion of the right parotid gland nonspecific T2 bright slightly heterogeneous 1.6 x 1 x 1.4 cm lesion. ENT consultation recommended for further delineation.  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 Results of this evaluation clearly are abnormal. There is nothing to suggest poor effort or feigning of symptoms. Furthermore, it appears that her cognitive deficits are beginning to interfere with her ability to manage complex tasks, such as finances and driving. As such, diagnostic criteria for a dementia syndrome are met.   The patient's cognitive profile is reflective of significant visual-spatial processing deficits with relatively preserved language and attention skills. 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 While the underlying cause of PCA is unknown in this patient's case, PCA is attributable to Alzheimer's disease in most patients and therefore is often considered an atypical variant of AD.   Recommendations: 1. Education regarding mild dementia and PCA was provided to the patient and her husband. Written materials were provided. Emotional support was provided. 2. The patient will require assistance with complex ADLs (especially those involving visual-spatial processing, such as driving and finances). I recommended that she stops driving at this point, based on her performance on all visual-spatial tasks and on a cognitive test highly correlated with driving ability. She and her husband understand and agree with this. 3. Cholinesterase inhibitor therapies have not been well researched in patients  with PCA. However, this could be considered. The patient will discuss this with Dr. Posey Pronto. 4. Psychotropic treatment of anxiety is requested by the patient and appears warranted. I would recommend an SSRI (e.g., Lexapro, Zoloft). 5. Neuropsychological evaluation in 9 months is indicated in order to monitor cognitive status, track progression of symptoms and further assist with treatment planning.  IMPRESSION/PLAN: Bilateral feet paresthesia, probably early neuropathy.  Exam shows mildly reduced distal sensory loss and absent reflexes at the ankles.  Motor strength is intact.  We discussed further testing with NCS/EMG of the legs, but given her intermittent symptoms there is a possibility of false negative so it was opted to consider testing, if symptoms get worse.  Check ESR, vitamin B12, folate, copper, SPEP with IFE for secondary causes.  OK to use gabapentin 273m at bedtime.  Recommend trying OTC lidocaine ointment to minimize cognitive side effects.   Posterior cortical atrophy, variant of Alzheimer's dementia (diagnosed 2017).  Clinically there is mild progression as expected with the course of the disease.  She is dependent on IADLs and no longer drives, manage finances, or work.  She is able to do all ADLs.    - Continue Aricept 141mdaily and Namenda 1057mID  - Continue daily exercise - encouraged exercises such as cycling, yoga, and water exercises  - Husband hand many questions regarding ongoing prognosis and management which were addressed to the best of my ability  [facility]  Greater than 50% of this 45 minute visit was spent in counseling, explanation of diagnosis, planning of further management, coordination of care.    Thank you for allowing me to participate in patient's care.  If I can answer any additional questions, I would be pleased to do so.    Sincerely,    Nakesha Ebrahim K. PatPosey ProntoO

## 2018-05-22 NOTE — Patient Instructions (Addendum)
You can try over the counter lidocaine ointment twice daily as needed (Aspercream, Salonpas)  Checks labs  Return to clinic in 6 months

## 2018-05-26 LAB — IMMUNOFIXATION ELECTROPHORESIS
IGM, SERUM: 30 mg/dL — AB (ref 50–300)
IMMUNOFIX ELECTR INT: NOT DETECTED
IgG (Immunoglobin G), Serum: 1054 mg/dL (ref 600–1540)
Immunoglobulin A: 199 mg/dL (ref 20–320)

## 2018-05-26 LAB — PROTEIN ELECTROPHORESIS, SERUM
ALBUMIN ELP: 4.8 g/dL (ref 3.8–4.8)
Alpha 1: 0.3 g/dL (ref 0.2–0.3)
Alpha 2: 0.6 g/dL (ref 0.5–0.9)
Beta 2: 0.4 g/dL (ref 0.2–0.5)
Beta Globulin: 0.5 g/dL (ref 0.4–0.6)
Gamma Globulin: 0.9 g/dL (ref 0.8–1.7)
TOTAL PROTEIN: 7.5 g/dL (ref 6.1–8.1)

## 2018-05-26 LAB — COPPER, SERUM: COPPER: 89 ug/dL (ref 70–175)

## 2018-06-01 DIAGNOSIS — Z0000 Encounter for general adult medical examination without abnormal findings: Secondary | ICD-10-CM | POA: Diagnosis not present

## 2018-06-01 DIAGNOSIS — Z114 Encounter for screening for human immunodeficiency virus [HIV]: Secondary | ICD-10-CM | POA: Diagnosis not present

## 2018-06-01 DIAGNOSIS — Z13220 Encounter for screening for lipoid disorders: Secondary | ICD-10-CM | POA: Diagnosis not present

## 2018-06-01 DIAGNOSIS — Z1329 Encounter for screening for other suspected endocrine disorder: Secondary | ICD-10-CM | POA: Diagnosis not present

## 2018-06-03 DIAGNOSIS — Z23 Encounter for immunization: Secondary | ICD-10-CM | POA: Diagnosis not present

## 2018-06-03 DIAGNOSIS — Z13220 Encounter for screening for lipoid disorders: Secondary | ICD-10-CM | POA: Diagnosis not present

## 2018-06-03 DIAGNOSIS — Z0000 Encounter for general adult medical examination without abnormal findings: Secondary | ICD-10-CM | POA: Diagnosis not present

## 2018-06-03 DIAGNOSIS — Z6824 Body mass index (BMI) 24.0-24.9, adult: Secondary | ICD-10-CM | POA: Diagnosis not present

## 2018-06-17 DIAGNOSIS — Z1231 Encounter for screening mammogram for malignant neoplasm of breast: Secondary | ICD-10-CM | POA: Diagnosis not present

## 2018-07-22 DIAGNOSIS — H52203 Unspecified astigmatism, bilateral: Secondary | ICD-10-CM | POA: Diagnosis not present

## 2018-07-22 DIAGNOSIS — H5202 Hypermetropia, left eye: Secondary | ICD-10-CM | POA: Diagnosis not present

## 2018-07-22 DIAGNOSIS — H5211 Myopia, right eye: Secondary | ICD-10-CM | POA: Diagnosis not present

## 2018-07-22 DIAGNOSIS — H524 Presbyopia: Secondary | ICD-10-CM | POA: Diagnosis not present

## 2018-07-22 DIAGNOSIS — H04123 Dry eye syndrome of bilateral lacrimal glands: Secondary | ICD-10-CM | POA: Diagnosis not present

## 2018-07-30 DIAGNOSIS — Z6826 Body mass index (BMI) 26.0-26.9, adult: Secondary | ICD-10-CM | POA: Diagnosis not present

## 2018-07-30 DIAGNOSIS — Z01419 Encounter for gynecological examination (general) (routine) without abnormal findings: Secondary | ICD-10-CM | POA: Diagnosis not present

## 2018-07-31 ENCOUNTER — Encounter

## 2018-07-31 ENCOUNTER — Ambulatory Visit: Payer: BLUE CROSS/BLUE SHIELD | Admitting: Neurology

## 2018-10-22 DIAGNOSIS — J301 Allergic rhinitis due to pollen: Secondary | ICD-10-CM | POA: Diagnosis not present

## 2018-10-22 DIAGNOSIS — J3089 Other allergic rhinitis: Secondary | ICD-10-CM | POA: Diagnosis not present

## 2018-10-22 DIAGNOSIS — K219 Gastro-esophageal reflux disease without esophagitis: Secondary | ICD-10-CM | POA: Diagnosis not present

## 2018-10-22 DIAGNOSIS — J3081 Allergic rhinitis due to animal (cat) (dog) hair and dander: Secondary | ICD-10-CM | POA: Diagnosis not present

## 2018-10-26 DIAGNOSIS — R1084 Generalized abdominal pain: Secondary | ICD-10-CM | POA: Diagnosis not present

## 2018-11-24 NOTE — Progress Notes (Signed)
Follow-up Visit   Date: 11/25/18     Margaret Johnston MRN: [id number] DOB: 05/04/1956   Interim History: Margaret Johnston is a 63 y.o. right-handed Caucasian female with hypothyroidism, GERD, and history of kidney stones returning to the clinic for follow-up of posterior cortical atrophy and and feet paresthesias  The patient was accompanied to the clinic by husband.  History of present illness: Her husband retired in August 2015 because of newly diagnosed neuroendocrine pancreatic cancer. Around this time, she began noticing that that she could not always remember the date. She had been managing finances for a number of years but in 2016, she delegated the responsibilities to her husband because of difficulty with numbers. She would often make errors such as saying "100" instead of "1000". She has restricted her driving to local distances only. She has not been involved in any MVAs. She has interests such as sewing and crochet and volunteers for the church. Sleep is good. Mood is good. Her husband states that there is some anxiety because of his medical problems since he is being monitored every 3 months for recurrence. She does all the cleaning and cooking at home. She has seven grandchildren, all in [place], except for one in [place] so they travel often.   UPDATE 02/15/2016:  Neuropsychological testing was most consistent with posterior cortical atrophy.  They have been researching the disease and have many questions.  They are very concerned about how to tell their children the diagnosis and sough the advice of a psychologist.  She was started on Aricept 61m and is tolerating it well.  She was started on social security benefits due to her dementia and are requesting a letter for disability benefits.  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    UPDATE 12/26/2016:    She is no longer driving because of visuospatial deficits, and this bothers her because she is always relying on her husband.  She tends to have cognitive and physician fatigue more with activities, such as weekend trips.  She has been reading and knitting, but takes more effort.    UPDATE 05/22/2018:  Over the past several years, she has noticed burning sensation over the feet, which often keeps her awake. She initially felt that it started following an ankle sprain in 2015.   Pain is intermittent and occurs about once per week. It is worse with prolonged activity.  She was given gabapentin 2084mat bedtime which she takes as needed for pain, which provides relief.  She feels sluggish in the morning when she takes the medication.  She denies imbalance or weakness.   With respect to her dementia, she is still able to do all ADLs.  She keeps up with household chores, except cooking recipes can be challenging because she forgets when she already added ingredient in already.  She does laundry and cleans.  Her husband manages finances and medications.  She has become more sedentary and does not cross stitch anymore.  She is sleeping more during the night and taking frequent naps.    UPDATE 11/25/2018:  She is here for 6 month follow-up visit.  Her feet paresthesias are less bothersome and when severe, she takes gabapentin 10058mt bedtime about once every 2 weeks which alleviate her pain. No new falls, hospitalizations, or imbalance.    Over the past year, husband has noticed difficulty writing "  m" and "n".  She cannot read hyms due to having to skip back and forth to the chorus.  She tends to get overwhelmed with tasks and planning. Husband also reports that she gets anxious in crowded areas.  She continues to do ADLs and household chores.  Husband notices small changes, but overall feels she is doing well.   Medications:  Current Outpatient Medications on File Prior to Visit  Medication Sig  .  Cholecalciferol (VITAMIN D3) 5000 units CAPS Take 1 capsule by mouth daily.  Marland Kitchen donepezil (ARICEPT) 10 MG tablet TAKE ONE-HALF TABLET BY MOUTH DAILY X 30 DAYS THEN INCREASE TO TAKE ONE TABLET BY MOUTH DAILY  . fenofibrate 160 MG tablet Take 160 mg by mouth daily.   . fexofenadine (ALLEGRA) 60 MG tablet Take 60 mg by mouth daily.  Marland Kitchen gabapentin (NEURONTIN) 100 MG capsule Take by mouth as needed.   Marland Kitchen ibuprofen (ADVIL,MOTRIN) 200 MG tablet Take 200 mg by mouth every 6 (six) hours as needed for moderate pain.  Marland Kitchen levothyroxine (SYNTHROID, LEVOTHROID) 88 MCG tablet   . memantine (NAMENDA) 10 MG tablet Take 1/2 pill qhs for 1 week, then 1/2 pill qam and qhs for 1 week, then 1/2 pill qam and 1 pill qhs for 1 week, then 1 pill bid thereafter  . Multiple Vitamin (MULTIVITAMIN WITH MINERALS) TABS Take 1 tablet by mouth daily.  . ranitidine (ZANTAC) 150 MG tablet Take 150 mg by mouth 2 (two) times daily.  . TURMERIC PO Take 1 capsule by mouth daily.   No current facility-administered medications on file prior to visit.    Allergies:  Allergies  Allergen Reactions  . Hydrocodone-Acetaminophen Other (See Comments)    Other  . Hydromorphone Itching  . Oxycodone Itching    Review of Systems:  CONSTITUTIONAL: No fevers, chills, night sweats, intentional weight loss.  EYES: No visual changes or eye pain ENT: No hearing changes.  No history of nose bleeds.   RESPIRATORY: No cough, wheezing and shortness of breath.   CARDIOVASCULAR: Negative for chest pain, and palpitations.   GI: Negative for abdominal discomfort, blood in stools or black stools.  No recent change in bowel habits.   GU:  No history of incontinence.   MUSCLOSKELETAL: No history of joint pain or swelling.  No myalgias.   SKIN: Negative for lesions, rash, and itching.   ENDOCRINE: Negative for cold or heat intolerance, polydipsia or goiter.   PSYCH:  + depression or anxiety symptoms.   NEURO: As Above.   Vital Signs:  Vitals:    11/25/18 0948  BP: 120/80  Pulse: 74  SpO2: 98%  Weight: 151 lb 8 oz (68.7 kg)  Height: '5\' 6"'  (1.676 m)     General Medical Exam:   General:  Well appearing, comfortable  Eyes/ENT: see cranial nerve examination.   Neck:   No carotid bruits. Respiratory:  Clear to auscultation, good air entry bilaterally.   Cardiac:  Regular rate and rhythm, no murmur.   Ext:  No edema  Neurological Exam: MENTAL STATUS including orientation to time, place, person, recent and remote memory, attention span and concentration, language, and fund of knowledge is fair.  Blunted affect.  Engages in conversation appropriately.  Montreal Cognitive Assessment  05/22/2018 12/07/2015  Visuospatial/ Executive (0/5) 1 3  Naming (0/3) 3 3  Attention: Read list of digits (0/2) 2 1  Attention: Read list of letters (0/1) 1 1  Attention: Serial 7 subtraction starting at 100 (0/3) 2 2  Language:  Repeat phrase (0/2) 2 2  Language : Fluency (0/1) 1 1  Abstraction (0/2) 1 2  Delayed Recall (0/5) 0 0  Orientation (0/6) 4 5  Total 17 20  Adjusted Score (based on education) 17 20    CRANIAL NERVES: Pupils are round and reactive to light. Extraocular muscles are intact. Visual fields are intact to confrontation.  Right ptosis (old). Face is symmetric. Palate elevates symmetrically. Tongue is midline.  MOTOR:  Motor strength is 5/5 in all extremities  REFLEXES:  Symmetric and brisk 2+/4 reflexes throughout, except absent bilateral Achilles.   SENSATION:  Vibration reduced at the great toe, intact at the ankles. Temperature is intact throughout.   COORDINATION/GAIT:   Gait narrow based and stable.   Data: Labs 12/07/2015:  Vitamin B12 510, vitamin E 16.7  MRI brain 12/12/2015:  Mild global atrophy most notable involving parietal lobes.  Partially calcified pineal gland with minimal impression upon the superior colliculus may represent an incidental finding. Follow-up as noted above.  Superficial aspect of the  posterior portion of the right parotid gland nonspecific T2 bright slightly heterogeneous 1.6 x 1 x 1.4 cm lesion. ENT consultation recommended for further delineation.  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 Results of this evaluation clearly are abnormal. There is nothing to suggest poor effort or feigning of symptoms. Furthermore, it appears that her cognitive deficits are beginning to interfere with her ability to manage complex tasks, such as finances and driving. As such, diagnostic criteria for a dementia syndrome are met.   The patient's cognitive profile is reflective of significant visual-spatial processing deficits with relatively preserved language and attention skills. 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 While the underlying cause of PCA is unknown in this patient's case, PCA is attributable to Alzheimer's disease in most patients and therefore is often considered an atypical variant of AD.   Recommendations: 1. Education regarding mild dementia and PCA was provided to the patient and her husband. Written materials were provided. Emotional support was provided. 2. The patient will require assistance with complex ADLs (especially those involving visual-spatial processing, such as driving and finances). I recommended that she stops driving at this point, based on her performance on all visual-spatial tasks and on a cognitive test highly correlated with driving ability. She and her husband understand and agree with this. 3. Cholinesterase inhibitor therapies have not been well researched in patients with PCA. However, this could be considered. The patient will discuss this with Dr. Posey Pronto. 4. Psychotropic  treatment of anxiety is requested by the patient and appears warranted. I would recommend an SSRI (e.g., Lexapro, Zoloft). 5. Neuropsychological evaluation in 9 months is indicated in order to monitor cognitive status, track progression of symptoms and further assist with treatment planning.  IMPRESSION/PLAN: 1.  Bilateral feet paresthesias, likely early neuropathy.  Symptoms are very mild and respond to gabapentin 19m at bedtime, as needed.  If symptoms get worse, proceed with NCS/EMG.  2.  Posterior cortical atrophy, variant of Alzheimer's dementia (diagnosed 2017), mild-moderate.  Overall, stable and describes more behavior issues such as anxiety/agorophobia.  She remains dependent on IADLs and no longer drives, manage finances, or work.  She is able to do all ADLs.    - Continue Aricept 198mdaily and Namenda 1022mID  - Continue daily exercise -  encouraged exercises such as cycling, yoga, and water exercises  - Discussed seeing counselor for cognitive behavior therapy, they will think about it  - Discussed in-home safety resources and provided them with Alzheimer's association website for further details  3.  Depression, followed by [facility] at [facility]  Return to clinic as needed  Greater than 50% of this 25 minute visit was spent in counseling and answering all their questions.   Thank you for allowing me to participate in patient's care.  If I can answer any additional questions, I would be pleased to do so.    Sincerely,    Donika K. Posey Pronto, DO

## 2018-11-25 ENCOUNTER — Ambulatory Visit (INDEPENDENT_AMBULATORY_CARE_PROVIDER_SITE_OTHER): Payer: BLUE CROSS/BLUE SHIELD | Admitting: Neurology

## 2018-11-25 ENCOUNTER — Other Ambulatory Visit: Payer: Self-pay

## 2018-11-25 ENCOUNTER — Encounter: Payer: Self-pay | Admitting: Neurology

## 2018-11-25 VITALS — BP 120/80 | HR 74 | Ht 66.0 in | Wt 151.5 lb

## 2018-11-25 DIAGNOSIS — R202 Paresthesia of skin: Secondary | ICD-10-CM | POA: Diagnosis not present

## 2018-11-25 DIAGNOSIS — G319 Degenerative disease of nervous system, unspecified: Secondary | ICD-10-CM

## 2018-11-25 NOTE — Patient Instructions (Addendum)
Please see in-home resources that may be helpful to you:  [email or web address]  Return to clinic as needed

## 2019-02-18 DIAGNOSIS — E785 Hyperlipidemia, unspecified: Secondary | ICD-10-CM | POA: Diagnosis not present

## 2019-02-18 DIAGNOSIS — E039 Hypothyroidism, unspecified: Secondary | ICD-10-CM | POA: Diagnosis not present

## 2019-02-18 DIAGNOSIS — E559 Vitamin D deficiency, unspecified: Secondary | ICD-10-CM | POA: Diagnosis not present

## 2019-02-23 DIAGNOSIS — E039 Hypothyroidism, unspecified: Secondary | ICD-10-CM | POA: Diagnosis not present

## 2019-02-23 DIAGNOSIS — R413 Other amnesia: Secondary | ICD-10-CM | POA: Diagnosis not present

## 2019-02-23 DIAGNOSIS — E559 Vitamin D deficiency, unspecified: Secondary | ICD-10-CM | POA: Diagnosis not present

## 2019-02-23 DIAGNOSIS — E785 Hyperlipidemia, unspecified: Secondary | ICD-10-CM | POA: Diagnosis not present

## 2019-03-27 IMAGING — DX DG ABDOMEN 1V
1 series · 1 of 1 positions shown · non-contrast
Comparison: CT abdomen dated 11/07/2017.

CLINICAL DATA: Pre Op ureteral stone, morning of procedure

EXAM:
ABDOMEN - 1 VIEW

[abdomen kub]
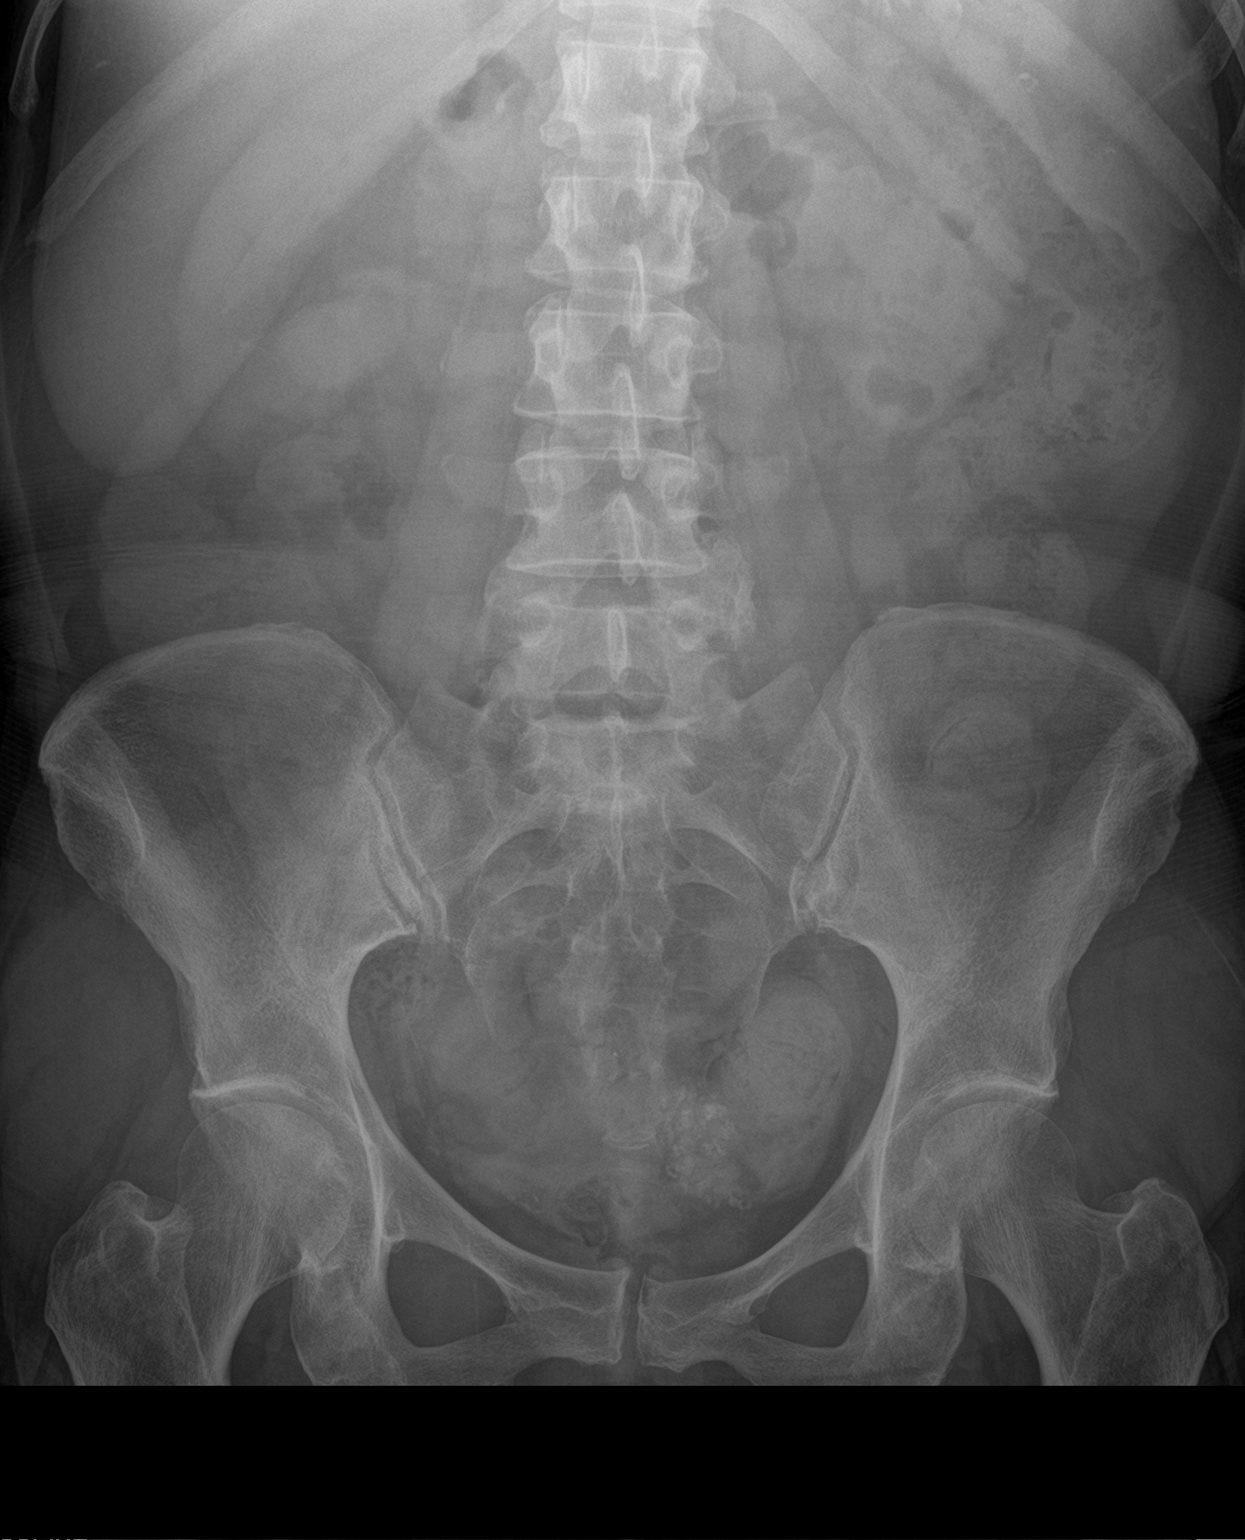

[1 of 1 positions shown; findings below may reference images not displayed]

FINDINGS: The right ureteral stone seen on recent CT is not appreciable on
today's plain film examination, presumably obscured by overlying
osseous and soft tissue structures.

Visualized bowel gas pattern is nonobstructive. No evidence of
abnormal fluid collection or free intraperitoneal air. Partially
calcified mass within the left hemipelvis, better demonstrated on
earlier CT, suspected partially calcified uterine fibroid. No acute
or suspicious osseous finding.
IMPRESSION: 1. No acute findings.
2. The right renal stone demonstrated on earlier CT of 11/07/2017 is
not appreciable on today's plain film examination, presumably
obscured by overlying osseous and soft tissue structures.

## 2019-07-30 ENCOUNTER — Other Ambulatory Visit: Payer: Self-pay

## 2019-07-30 DIAGNOSIS — Z20822 Contact with and (suspected) exposure to covid-19: Secondary | ICD-10-CM

## 2019-08-02 LAB — NOVEL CORONAVIRUS, NAA: SARS-CoV-2, NAA: NOT DETECTED

## 2019-10-18 ENCOUNTER — Other Ambulatory Visit: Payer: Self-pay | Admitting: Urology

## 2019-10-18 DIAGNOSIS — N201 Calculus of ureter: Secondary | ICD-10-CM

## 2019-10-19 ENCOUNTER — Other Ambulatory Visit (HOSPITAL_COMMUNITY)
Admission: RE | Admit: 2019-10-19 | Discharge: 2019-10-19 | Disposition: A | Discharge status: Home / Self Care | Payer: Medicare Other | Source: Ambulatory Visit | Attending: Urology | Admitting: Urology

## 2019-10-19 DIAGNOSIS — Z01812 Encounter for preprocedural laboratory examination: Secondary | ICD-10-CM | POA: Insufficient documentation

## 2019-10-19 DIAGNOSIS — Z20822 Contact with and (suspected) exposure to covid-19: Secondary | ICD-10-CM | POA: Diagnosis not present

## 2019-10-19 LAB — SARS CORONAVIRUS 2 (TAT 6-24 HRS): SARS Coronavirus 2: NEGATIVE

## 2019-10-19 NOTE — Discharge Instructions (Signed)
Lithotripsy, Care After This sheet gives you information about how to care for yourself after your procedure. Your health care provider may also give you more specific instructions. If you have problems or questions, contact your health care provider. What can I expect after the procedure? After the procedure, it is common to have:  Some blood in your urine. This should only last for a few days.  Soreness in your back, sides, or upper abdomen for a few days.  Blotches or bruises on your back where the pressure wave entered the skin.  Pain, discomfort, or nausea when pieces (fragments) of the kidney stone move through the tube that carries urine from the kidney to the bladder (ureter). Stone fragments may pass soon after the procedure, but they may continue to pass for up to 4-8 weeks. ? If you have severe pain or nausea, contact your health care provider. This may be caused by a large stone that was not broken up, and this may mean that you need more treatment.  Some pain or discomfort during urination.  Some pain or discomfort in the lower abdomen or (in men) at the base of the penis. Follow these instructions at home: Medicines  Take over-the-counter and prescription medicines only as told by your health care provider.  If you were prescribed an antibiotic medicine, take it as told by your health care provider. Do not stop taking the antibiotic even if you start to feel better.  Do not drive for 24 hours if you were given a medicine to help you relax (sedative).  Do not drive or use heavy machinery while taking prescription pain medicine. Eating and drinking      Drink enough water and fluids to keep your urine clear or pale yellow. This helps any remaining pieces of the stone to pass. It can also help prevent new stones from forming.  Eat plenty of fresh fruits and vegetables.  Follow instructions from your health care provider about eating and drinking restrictions. You may be  instructed: ? To reduce how much salt (sodium) you eat or drink. Check ingredients and nutrition facts on packaged foods and beverages. ? To reduce how much meat you eat.  Eat the recommended amount of calcium for your age and gender. Ask your health care provider how much calcium you should have. General instructions  Get plenty of rest.  Most people can resume normal activities 1-2 days after the procedure. Ask your health care provider what activities are safe for you.  Your health care provider may direct you to lie in a certain position (postural drainage) and tap firmly (percuss) over your kidney area to help stone fragments pass. Follow instructions as told by your health care provider.  If directed, strain all urine through the strainer that was provided by your health care provider. ? Keep all fragments for your health care provider to see. Any stones that are found may be sent to a medical lab for examination. The stone may be as small as a grain of salt.  Keep all follow-up visits as told by your health care provider. This is important. Contact a health care provider if:  You have pain that is severe or does not get better with medicine.  You have nausea that is severe or does not go away.  You have blood in your urine longer than your health care provider told you to expect.  You have more blood in your urine.  You have pain during urination that does   not go away.  You urinate more frequently than usual and this does not go away.  You develop a rash or any other possible signs of an allergic reaction. Get help right away if:  You have severe pain in your back, sides, or upper abdomen.  You have severe pain while urinating.  Your urine is very dark red.  You have blood in your stool (feces).  You cannot pass any urine at all.  You feel a strong urge to urinate after emptying your bladder.  You have a fever or chills.  You develop shortness of breath,  difficulty breathing, or chest pain.  You have severe nausea that leads to persistent vomiting.  You faint. Summary  After this procedure, it is common to have some pain, discomfort, or nausea when pieces (fragments) of the kidney stone move through the tube that carries urine from the kidney to the bladder (ureter). If this pain or nausea is severe, however, you should contact your health care provider.  Most people can resume normal activities 1-2 days after the procedure. Ask your health care provider what activities are safe for you.  Drink enough water and fluids to keep your urine clear or pale yellow. This helps any remaining pieces of the stone to pass, and it can help prevent new stones from forming.  If directed, strain your urine and keep all fragments for your health care provider to see. Fragments or stones may be as small as a grain of salt.  Get help right away if you have severe pain in your back, sides, or upper abdomen or have severe pain while urinating. This information is not intended to replace advice given to you by your health care provider. Make sure you discuss any questions you have with your health care provider. Document Revised: 12/07/2018 Document Reviewed: 07/17/2016 Elsevier Patient Education  2020 [employer]  

## 2019-10-19 NOTE — H&P (Signed)
HPI: Margaret Johnston is a 64 year-old female with a right ureteral stone.  Right mid ureteral stone treated w/ ESL on 3.4.2019. Prior to the procedure, CT revealed the solitary right ureteral stone and no evidence of renal calculi. She does have recurrent stones in the past, however.   8.12.2020: Today's KUB clear of stones. She does report some minor intermittent lower abdominal pain that makes her worry about stones, but she notes this is not really a major concern. She has lowered her intake of non-water fluids and has been maintaining a healthy diet. Overall, she is very satisfied with her urological health since last visit.   10/14/19: Patient with past history of stones noted above. She presents today with complaints of right flank and right lower quadrant pain. She states that right lower quadrant pain has been occurring intermittently for the past week. However, she noted more significant exacerbation of the pain this morning. She is currently complaining of right lower quadrant pain radiating to her groin. She has had some mild associated nausea and reflux symptoms, but denies vomiting. She denies significant changes to lower urinary tract symptoms. No gross hematuria, fever, or chills.   The problem is on the right side. She has had eswlvfor treatment of her ureteral calculi. Patient denies stent, ureteroscopy, and percutaneous lithotomy. This procedure was done 11/10/2017.     ALLERGIES: HYDROmorphone HCl TABS - Itching Oxycodone - itching but tolerable-    MEDICATIONS: Aricept 10 mg tablet  Fenofibrate  Gabapentin PRN  Levothyroxine Sodium 100 MCG Oral Tablet Oral  Namenda 10 mg tablet  Singulair 10 mg tablet  Tagamet Hb  Tumeric  Vitamin D3 10,000 units     GU PSH: ESWL - 11/10/2017, 2013, 2013, 2012, 2012     NON-GU PSH: Hysteroscope Procedure     GU PMH: Ureteral calculus (Improving), History of recurrent urolithiasis, currently no evidence of calculous disease - 04/21/2019,  (Stable), History of recurrent urolithiasis with most recent ureteral stone treated on 3.4.2019. No evidence of ureteral or renal calculi, - 03/04/2018, - 12/02/2017 (Acute), Right, 6 mm rt mid ureteral stone HU 630 SSD 11, - 11/07/2017 (Acute), Left distal ureteral stone, 5 mm in size. Symptomatically, this is probably right at the left UVJ. She is doing fairly well with this., - 2017, Mid Ureteral Stone On The Left, - 2014, Distal Ureteral Stone On The Left, - 2014, Mid Ureteral Stone On The Right, - 2014 Hydronephrosis Unspec, Hydronephrosis, right - 2014 Renal calculus, Nephrolithiasis - 2014 Other microscopic hematuria, Microscopic hematuria - 2014    NON-GU PMH: Alzheimer''s disease with early onset - 2017 Encounter for general adult medical examination without abnormal findings, Encounter for preventive health examination - 2014 Personal history of other diseases of the musculoskeletal system and connective tissue, History of osteoporosis - 2014 Anxiety, Anxiety (Symptom) - 2014 Personal history of other diseases of the digestive system, History of esophageal reflux - 2014 Personal history of other endocrine, nutritional and metabolic disease, History of hypercholesterolemia - 2014, History of hypothyroidism, - 2014 Personal history of other mental and behavioral disorders, History of depression - 2014    FAMILY HISTORY: CABG - Father Chronic Interstitial Cystitis - Brother, Mother Coronary Artery Disease - Father Death - Mother Family Health Status Number - Runs In Family Hypertension - Runs In Family multiple sclerosis - Sister   SOCIAL HISTORY: Marital Status: Married Preferred Language: English; Ethnicity: Not Hispanic Or Latino; Race: White Current Smoking Status: Patient has never smoked.  Social Drinker.  Drinks 2 caffeinated drinks per day. Patient's occupation is/was retired.    REVIEW OF SYSTEMS:    GU Review Female:   Patient denies frequent urination, hard to postpone  urination, burning /pain with urination, get up at night to urinate, leakage of urine, stream starts and stops, trouble starting your stream, have to strain to urinate, and being pregnant.  Gastrointestinal (Upper):   Patient reports indigestion/ heartburn. Patient denies nausea and vomiting.  Gastrointestinal (Lower):   Patient denies diarrhea and constipation.  Constitutional:   Patient denies fever, night sweats, weight loss, and fatigue.  Skin:   Patient denies skin rash/ lesion and itching.  Eyes:   Patient denies blurred vision and double vision.  Ears/ Nose/ Throat:   Patient denies sore throat and sinus problems.  Hematologic/Lymphatic:   Patient denies swollen glands and easy bruising.  Cardiovascular:   Patient denies leg swelling and chest pains.  Respiratory:   Patient denies cough and shortness of breath.  Endocrine:   Patient denies excessive thirst.  Musculoskeletal:   Patient reports back pain. Patient denies joint pain.  Neurological:   Patient denies headaches and dizziness.  Psychologic:   Patient denies depression and anxiety.   VITAL SIGNS:    Weight 155 lb / 70.31 kg  Height 65 in / 165.1 cm  BP 135/76 mmHg  Pulse 54 /min  Temperature 97.5 F / 36.3 C  BMI 25.8 kg/m   MULTI-SYSTEM PHYSICAL EXAMINATION:    Constitutional: Well-nourished. No physical deformities. Normally developed. Good grooming.  Respiratory: No labored breathing, no use of accessory muscles.   Cardiovascular: Normal temperature, normal extremity pulses, no swelling, no varicosities.  Neurologic / Psychiatric: Oriented to time, oriented to place, oriented to person. No depression, no anxiety, no agitation.  Gastrointestinal: No mass, no tenderness, no rigidity, non obese abdomen. No CVAT. Mild right lower quadrant tenderness.  Musculoskeletal: Normal gait and station of head and neck.     PAST DATA REVIEWED:  Source Of History:  Patient  Records Review:   Previous Patient Records  Urine Test  Review:   Urinalysis  X-Ray Review: KUB: Reviewed Films.  C.T. Abdomen/Pelvis: Reviewed Films. Reviewed Report.     PROCEDURES:         C.T. Urogram - [id number]  IMPRESSION:  1. 4 x 4 x 7 mm proximal to mid right ureteral stone causes moderate  right hydroureteronephrosis.  2. 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  3. Hepatic steatosis.  4. Aortic Atherosclerois           Urinalysis w/Scope Dipstick Dipstick Cont'd Micro  Color: Yellow Bilirubin: Neg mg/dL WBC/hpf: 0 - 5/hpf  Appearance: Clear Ketones: Neg mg/dL RBC/hpf: >60/hpf  Specific Gravity: 1.030 Blood: 3+ ery/uL Bacteria: Few (10-25/hpf)  pH: <=5.0 Protein: 1+ mg/dL Cystals: NS (Not Seen)  Glucose: Neg mg/dL Urobilinogen: 0.2 mg/dL Casts: NS (Not Seen)    Nitrites: Neg Trichomonas: Not Present    Leukocyte Esterase: Neg leu/uL Mucous: Not Present      Epithelial Cells: 0 - 5/hpf      Yeast: NS (Not Seen)      Sperm: Not Present    ASSESSMENT:      ICD-10 Details  1 GU:   Renal calculus - N20.0   2   Other microscopic hematuria - R31.29   3   Flank Pain - R10.84   4   Ureteral calculus - N20.1 Acute, Systemic Symptoms   PLAN:  Urinalysis today shows microscopic hematuria and mild bacteriuria. Precautionary culture sent nd found to be negative. CT imaging reveals an approximately 6 mm mid ureteral calculus with moderate to severe hydronephrosis. We reviewed treatment options in detail. Given size of her calculus and degree of obstruction, she will likely need to proceed with intervention. She has done well with shockwave lithotripsy in the past. We again reviewed this procedure and associated risk in detail today. She voiced understanding.

## 2019-10-21 ENCOUNTER — Ambulatory Visit (HOSPITAL_BASED_OUTPATIENT_CLINIC_OR_DEPARTMENT_OTHER)
Admission: RE | Admit: 2019-10-21 | Discharge: 2019-10-21 | Disposition: A | Discharge status: Home / Self Care | Payer: Medicare Other | Attending: Urology | Admitting: Urology

## 2019-10-21 ENCOUNTER — Other Ambulatory Visit: Payer: Self-pay

## 2019-10-21 ENCOUNTER — Other Ambulatory Visit: Payer: Self-pay | Admitting: Urology

## 2019-10-21 ENCOUNTER — Encounter (HOSPITAL_BASED_OUTPATIENT_CLINIC_OR_DEPARTMENT_OTHER): Payer: Self-pay | Admitting: Urology

## 2019-10-21 ENCOUNTER — Encounter (HOSPITAL_BASED_OUTPATIENT_CLINIC_OR_DEPARTMENT_OTHER)
Admission: RE | Disposition: A | Discharge status: Home / Self Care | Payer: Self-pay | Source: Home / Self Care | Attending: Urology

## 2019-10-21 ENCOUNTER — Ambulatory Visit (HOSPITAL_COMMUNITY): Payer: Medicare Other

## 2019-10-21 DIAGNOSIS — I728 Aneurysm of other specified arteries: Secondary | ICD-10-CM | POA: Insufficient documentation

## 2019-10-21 DIAGNOSIS — Z87442 Personal history of urinary calculi: Secondary | ICD-10-CM | POA: Insufficient documentation

## 2019-10-21 DIAGNOSIS — Z79899 Other long term (current) drug therapy: Secondary | ICD-10-CM | POA: Diagnosis not present

## 2019-10-21 DIAGNOSIS — Z5309 Procedure and treatment not carried out because of other contraindication: Secondary | ICD-10-CM | POA: Insufficient documentation

## 2019-10-21 DIAGNOSIS — K760 Fatty (change of) liver, not elsewhere classified: Secondary | ICD-10-CM | POA: Insufficient documentation

## 2019-10-21 DIAGNOSIS — I700 Atherosclerosis of aorta: Secondary | ICD-10-CM | POA: Diagnosis not present

## 2019-10-21 DIAGNOSIS — F419 Anxiety disorder, unspecified: Secondary | ICD-10-CM

## 2019-10-21 DIAGNOSIS — R3129 Other microscopic hematuria: Secondary | ICD-10-CM | POA: Insufficient documentation

## 2019-10-21 DIAGNOSIS — Z8249 Family history of ischemic heart disease and other diseases of the circulatory system: Secondary | ICD-10-CM | POA: Diagnosis not present

## 2019-10-21 DIAGNOSIS — R8271 Bacteriuria: Secondary | ICD-10-CM | POA: Insufficient documentation

## 2019-10-21 DIAGNOSIS — N201 Calculus of ureter: Secondary | ICD-10-CM | POA: Diagnosis present

## 2019-10-21 DIAGNOSIS — Z820 Family history of epilepsy and other diseases of the nervous system: Secondary | ICD-10-CM | POA: Diagnosis not present

## 2019-10-21 DIAGNOSIS — N132 Hydronephrosis with renal and ureteral calculous obstruction: Secondary | ICD-10-CM | POA: Diagnosis not present

## 2019-10-21 DIAGNOSIS — Z885 Allergy status to narcotic agent status: Secondary | ICD-10-CM | POA: Insufficient documentation

## 2019-10-21 DIAGNOSIS — Z841 Family history of disorders of kidney and ureter: Secondary | ICD-10-CM | POA: Insufficient documentation

## 2019-10-21 SURGERY — LITHOTRIPSY, ESWL
Anesthesia: LOCAL

## 2019-10-21 MED ORDER — SODIUM CHLORIDE 0.9 % IV SOLN
INTRAVENOUS | Status: DC
  Filled 2019-10-21: qty 1000

## 2019-10-21 MED ORDER — DIPHENHYDRAMINE HCL 25 MG PO CAPS
ORAL_CAPSULE | ORAL | Status: AC
  Filled 2019-10-21: qty 1

## 2019-10-21 MED ORDER — DIAZEPAM 5 MG PO TABS
10.0000 mg | ORAL_TABLET | ORAL | Status: AC
  Administered 2019-10-21: 10 mg via ORAL
  Filled 2019-10-21: qty 2

## 2019-10-21 MED ORDER — DIPHENHYDRAMINE HCL 25 MG PO CAPS
25.0000 mg | ORAL_CAPSULE | ORAL | Status: AC
  Administered 2019-10-21: 25 mg via ORAL
  Filled 2019-10-21: qty 1

## 2019-10-21 MED ORDER — CIPROFLOXACIN HCL 500 MG PO TABS
ORAL_TABLET | ORAL | Status: AC
  Filled 2019-10-21: qty 1

## 2019-10-21 MED ORDER — CIPROFLOXACIN HCL 500 MG PO TABS
500.0000 mg | ORAL_TABLET | ORAL | Status: AC
  Administered 2019-10-21: 500 mg via ORAL
  Filled 2019-10-21: qty 1

## 2019-10-21 MED ORDER — DIAZEPAM 5 MG PO TABS
ORAL_TABLET | ORAL | Status: AC
  Filled 2019-10-21: qty 2

## 2019-10-21 NOTE — Progress Notes (Signed)
Pt and spouse reports that patient took a sip of water with am medication and then en route to the hospital she had another sip of water in the car.   Patient was cancelled by the [facility] nurse for having too much water this morning.   Spouse made aware of situation and patient discharged in NAD.   Pt instructed to call the office to reschedule.

## 2019-10-22 ENCOUNTER — Other Ambulatory Visit (HOSPITAL_COMMUNITY): Payer: Medicare Other

## 2019-10-23 ENCOUNTER — Telehealth: Payer: Self-pay | Admitting: Urology

## 2019-10-23 ENCOUNTER — Other Ambulatory Visit (HOSPITAL_COMMUNITY): Admission: RE | Admit: 2019-10-23 | Payer: Medicare Other | Source: Ambulatory Visit

## 2019-10-23 NOTE — Progress Notes (Signed)
Spoke with patients husband who stated that patient passed her kidney stone and will be cancelling her procedure for Monday, he stated that he already contacted the office

## 2019-10-23 NOTE — Telephone Encounter (Signed)
Patient PASSED her kidney stone. This morning. No longer needs ESWL.   Case for 2/15 is CANCELLED. Will message Dr. Liliane Shi

## 2019-10-25 ENCOUNTER — Ambulatory Visit (HOSPITAL_BASED_OUTPATIENT_CLINIC_OR_DEPARTMENT_OTHER): Admission: RE | Admit: 2019-10-25 | Payer: Medicare Other | Source: Home / Self Care | Admitting: Urology

## 2019-10-25 SURGERY — LITHOTRIPSY, ESWL
Anesthesia: LOCAL | Laterality: Right

## 2019-12-23 ENCOUNTER — Other Ambulatory Visit: Payer: Self-pay | Admitting: Family Medicine

## 2019-12-23 DIAGNOSIS — E2839 Other primary ovarian failure: Secondary | ICD-10-CM

## 2020-03-03 ENCOUNTER — Ambulatory Visit
Admission: RE | Admit: 2020-03-03 | Discharge: 2020-03-03 | Disposition: A | Discharge status: Home / Self Care | Payer: Medicare Other | Source: Ambulatory Visit | Attending: Family Medicine | Admitting: Family Medicine

## 2020-03-03 ENCOUNTER — Other Ambulatory Visit: Payer: Self-pay

## 2020-03-03 DIAGNOSIS — E2839 Other primary ovarian failure: Secondary | ICD-10-CM

## 2020-07-06 ENCOUNTER — Other Ambulatory Visit: Payer: Self-pay

## 2020-07-06 ENCOUNTER — Encounter (HOSPITAL_COMMUNITY): Payer: Self-pay

## 2020-07-06 ENCOUNTER — Emergency Department (HOSPITAL_COMMUNITY)
Admission: EM | Admit: 2020-07-06 | Discharge: 2020-07-07 | Disposition: A | Discharge status: Home / Self Care | Payer: Medicare Other | Attending: Emergency Medicine | Admitting: Emergency Medicine

## 2020-07-06 DIAGNOSIS — R109 Unspecified abdominal pain: Secondary | ICD-10-CM | POA: Diagnosis present

## 2020-07-06 DIAGNOSIS — N201 Calculus of ureter: Secondary | ICD-10-CM | POA: Diagnosis not present

## 2020-07-06 DIAGNOSIS — E039 Hypothyroidism, unspecified: Secondary | ICD-10-CM | POA: Insufficient documentation

## 2020-07-06 DIAGNOSIS — N189 Chronic kidney disease, unspecified: Secondary | ICD-10-CM | POA: Insufficient documentation

## 2020-07-06 MED ORDER — ONDANSETRON HCL 4 MG/2ML IJ SOLN
4.0000 mg | Freq: Once | INTRAMUSCULAR | Status: AC
  Administered 2020-07-07: 4 mg via INTRAVENOUS
  Filled 2020-07-06: qty 2

## 2020-07-06 MED ORDER — FENTANYL CITRATE (PF) 100 MCG/2ML IJ SOLN
50.0000 ug | Freq: Once | INTRAMUSCULAR | Status: AC
  Administered 2020-07-07: 50 ug via INTRAVENOUS
  Filled 2020-07-06: qty 2

## 2020-07-06 NOTE — ED Provider Notes (Signed)
[facility]-EMERGENCY DEPT Provider Note: Margaret Dell, MD, FACEP  CSN: [id number] MRN: [id number] ARRIVAL: 07/06/20 at 2302 ROOM: WA15/WA15   CHIEF COMPLAINT  Flank Pain   HISTORY OF PRESENT ILLNESS  07/06/20 11:34 PM Margaret Johnston is a 64 y.o. female with a history of kidney stones.  She is here with right flank pain that began earlier today and has gradually worsened.  She now rates the pain is a 5 out of 10.  It is characterized as like previous kidney stones.  It radiates to her right lower quadrant.  It is not worse with movement or position.  It has been associated with nausea and hematuria but no vomiting.  She took 1 Percocet earlier without relief.   Past Medical History:  Diagnosis Date  . Alzheimer's disease with early onset (HCC)   . Chronic kidney disease    right ureteral stone  . Depression   . GERD (gastroesophageal reflux disease)    rarely takes Prilosec  . History of kidney stones   . Hypercholesterolemia   . Hypothyroidism   . Renal stones     Past Surgical History:  Procedure Laterality Date  . CATARACT EXTRACTION    . CERVICAL POLYPECTOMY    . EXTRACORPOREAL SHOCK WAVE LITHOTRIPSY Right 11/10/2017   Procedure: RIGHT EXTRACORPOREAL SHOCK WAVE LITHOTRIPSY (ESWL);  Surgeon: Jerilee Field, MD;  Location: [facility] ORS;  Service: Urology;  Laterality: Right;  . LITHOTRIPSY      Family History  Problem Relation Age of Onset  . Mental illness Mother   . Diabetes Mother   . Heart disease Father        Living  . Kidney disease Father   . Cancer Maternal Grandmother   . Alzheimer's disease Paternal Grandmother   . Multiple sclerosis Sister   . Headache Sister   . Healthy Daughter   . Hypothyroidism Daughter     Social History   Tobacco Use  . Smoking status: Never Smoker  . Smokeless tobacco: Never Used  Vaping Use  . Vaping Use: Never used  Substance Use Topics  . Alcohol use: Yes    Alcohol/week: 0.0 standard drinks    Comment: rarely  . Drug  use: No    Prior to Admission medications   Medication Sig Start Date End Date Taking? Authorizing Provider  Cholecalciferol (VITAMIN D3) 5000 units CAPS Take 1 capsule by mouth daily.    [provider]  donepezil (ARICEPT) 10 MG tablet TAKE ONE-HALF TABLET BY MOUTH DAILY X 30 DAYS THEN INCREASE TO TAKE ONE TABLET BY MOUTH DAILY 05/08/18   Allena Katz, Donika K, DO  fenofibrate 160 MG tablet Take 160 mg by mouth daily.  01/22/16   [provider]  fexofenadine (ALLEGRA) 60 MG tablet Take 60 mg by mouth daily.    [provider]  gabapentin (NEURONTIN) 100 MG capsule Take by mouth as needed.     [provider]  ibuprofen (ADVIL,MOTRIN) 200 MG tablet Take 200 mg by mouth every 6 (six) hours as needed for moderate pain.    [provider]  levothyroxine (SYNTHROID, LEVOTHROID) 88 MCG tablet  04/17/18   [provider]  memantine (NAMENDA) 10 MG tablet Take 1/2 pill qhs for 1 week, then 1/2 pill qam and qhs for 1 week, then 1/2 pill qam and 1 pill qhs for 1 week, then 1 pill bid thereafter 10/29/17   [provider]  Multiple Vitamin (MULTIVITAMIN WITH MINERALS) TABS Take 1 tablet by mouth daily.  [provider]  ranitidine (ZANTAC) 150 MG tablet Take 150 mg by mouth 2 (two) times daily.    [provider]  TURMERIC PO Take 1 capsule by mouth daily.    [provider]    Allergies Hydrocodone-acetaminophen, Hydromorphone, Oxycodone, and Hydrocodone-acetaminophen   REVIEW OF SYSTEMS  Negative except as noted here or in the History of Present Illness.   PHYSICAL EXAMINATION  Initial Vital Signs Blood pressure 109/87, pulse 76, temperature 98.8 F (37.1 C), temperature source Oral, resp. rate 16, height 5\' 5"  (1.651 m), weight 68 kg, SpO2 97 %.  Examination General: Well-developed, well-nourished female in no acute distress; appearance consistent with age of record HENT: normocephalic; atraumatic Eyes:  Normal appearance Neck: supple Heart: regular rate and rhythm Lungs: clear to auscultation bilaterally Abdomen: soft; nondistended; nontender; bowel sounds present GU: No CVA tenderness Extremities: No deformity; full range of motion; pulses normal Neurologic: Awake, alert and oriented; motor function intact in all extremities and symmetric; no facial droop Skin: Warm and dry Psychiatric: Grimacing   RESULTS  Summary of this visit's results, reviewed and interpreted by myself:   EKG Interpretation  Date/Time:    Ventricular Rate:    PR Interval:    QRS Duration:   QT Interval:    QTC Calculation:   R Axis:     Text Interpretation:        Laboratory Studies: Results for orders placed or performed during the hospital encounter of 07/06/20 (from the past 24 hour(s))  Urinalysis, Routine w reflex microscopic Urine, Clean Catch     Status: Abnormal   Collection Time: 07/06/20 11:42 PM  Result Value Ref Range   Color, Urine YELLOW YELLOW   APPearance HAZY (A) CLEAR   Specific Gravity, Urine 1.026 1.005 - 1.030   pH 5.0 5.0 - 8.0   Glucose, UA NEGATIVE NEGATIVE mg/dL   Hgb urine dipstick LARGE (A) NEGATIVE   Bilirubin Urine NEGATIVE NEGATIVE   Ketones, ur 5 (A) NEGATIVE mg/dL   Protein, ur NEGATIVE NEGATIVE mg/dL   Nitrite NEGATIVE NEGATIVE   Leukocytes,Ua NEGATIVE NEGATIVE   RBC / HPF >50 (H) 0 - 5 RBC/hpf   WBC, UA 6-10 0 - 5 WBC/hpf   Bacteria, UA RARE (A) NONE SEEN   Squamous Epithelial / LPF 0-5 0 - 5   Mucus PRESENT    Ca Oxalate Crys, UA PRESENT    Imaging Studies: CT Renal Stone Study  Result Date: 07/07/2020 CLINICAL DATA:  Right flank pain EXAM: CT ABDOMEN AND PELVIS WITHOUT CONTRAST TECHNIQUE: Multidetector CT imaging of the abdomen and pelvis was performed following the standard protocol without IV contrast. COMPARISON:  10/14/2019 FINDINGS: LOWER CHEST: Normal. HEPATOBILIARY: Diffuse hypoattenuation of the liver relative to the spleen suggests hepatic  steatosis. No focal liver lesion or biliary dilatation. The gallbladder is normal. PANCREAS: Normal pancreas. No ductal dilatation or peripancreatic fluid collection. SPLEEN: Normal. ADRENALS/URINARY TRACT: The adrenal glands are normal. There is a 5 mm obstructing stone in the distal right ureter with moderate right hydroureteronephrosis. The left kidney is normal. The urinary bladder is normal for degree of distention STOMACH/BOWEL: There is no hiatal hernia. Normal duodenal course and caliber. No small bowel dilatation or inflammation. No focal colonic abnormality. Normal appendix. VASCULAR/LYMPHATIC: Normal course and caliber of the major abdominal vessels. No abdominal or pelvic lymphadenopathy. REPRODUCTIVE: Normal uterus. No adnexal mass. MUSCULOSKELETAL. No bony spinal canal stenosis or focal osseous abnormality. OTHER: None. IMPRESSION: 1. Obstructing 5 mm stone in the distal right  ureter with moderate right hydroureteronephrosis. 2. Hepatic steatosis. Electronically Signed   By: Deatra Robinson M.D.   On: 07/07/2020 01:02    ED COURSE and MDM  Nursing notes, initial and subsequent vitals signs, including pulse oximetry, reviewed and interpreted by myself.  Vitals:   07/07/20 0230 07/07/20 0245 07/07/20 0300 07/07/20 0315  BP:   (!) 111/59   Pulse: (!) 52 (!) 50  (!) 54  Resp:      Temp:      TempSrc:      SpO2: 95% 94%  94%  Weight:      Height:       Medications  fentaNYL (SUBLIMAZE) injection 50 mcg (has no administration in time range)  ondansetron (ZOFRAN) injection 4 mg (4 mg Intravenous Given 07/07/20 0000)  fentaNYL (SUBLIMAZE) injection 50 mcg (50 mcg Intravenous Given 07/07/20 0000)  sodium chloride 0.9 % bolus 1,000 mL (1,000 mLs Intravenous New Bag/Given 07/07/20 0143)   3:33 AM Urinalysis not consistent with UTI but sent for culture as a precaution.  Patient advised of CT findings showing a 5 mm stone in the distal right ureter.  This may require urologic intervention.   Coincidentally, the patient had an appointment with her urologist, Dr. Retta Diones, the day before yesterday before symptoms developed.  Pain adequately controlled at this time.  Patient has prescriptions for Percocet and Flomax.  She was advised to call Dr. Lenoria Chime office later this morning for follow-up.   PROCEDURES  Procedures   ED DIAGNOSES     ICD-10-CM   1. Ureterolithiasis  N20.1        Danijela Vessey, MD 07/07/20 [phone]

## 2020-07-06 NOTE — ED Triage Notes (Addendum)
Pt reports right flank pain that has radiated to RLQ. Pt says a hx of kidney stones and blood in urine. Sts she is taking percocet for pain management.

## 2020-07-07 ENCOUNTER — Emergency Department (HOSPITAL_COMMUNITY): Payer: Medicare Other

## 2020-07-07 ENCOUNTER — Other Ambulatory Visit (HOSPITAL_COMMUNITY): Payer: Medicare Other

## 2020-07-07 DIAGNOSIS — N201 Calculus of ureter: Secondary | ICD-10-CM | POA: Diagnosis not present

## 2020-07-07 LAB — URINALYSIS, ROUTINE W REFLEX MICROSCOPIC
Bilirubin Urine: NEGATIVE
Glucose, UA: NEGATIVE mg/dL
Ketones, ur: 5 mg/dL — AB
Leukocytes,Ua: NEGATIVE
Nitrite: NEGATIVE
Protein, ur: NEGATIVE mg/dL
RBC / HPF: >50 RBC/hpf — ABNORMAL HIGH (ref 0–5)
Specific Gravity, Urine: 1.026 (ref 1.005–1.030)
pH: 5 (ref 5.0–8.0)

## 2020-07-07 MED ORDER — FENTANYL CITRATE (PF) 100 MCG/2ML IJ SOLN: 50.0000 ug | INTRAMUSCULAR | Status: DC | PRN

## 2020-07-07 MED ORDER — SODIUM CHLORIDE 0.9 % IV BOLUS
1000.0000 mL | Freq: Once | INTRAVENOUS | Status: AC
  Administered 2020-07-07: 1000 mL via INTRAVENOUS

## 2020-07-08 LAB — URINE CULTURE: Culture: <10000 — AB

## 2021-11-22 ENCOUNTER — Other Ambulatory Visit: Payer: Self-pay | Admitting: Family Medicine

## 2021-11-27 ENCOUNTER — Ambulatory Visit
Admission: RE | Admit: 2021-11-27 | Discharge: 2021-11-27 | Disposition: A | Discharge status: Home / Self Care | Payer: Medicare Other | Source: Ambulatory Visit | Attending: Family Medicine | Admitting: Family Medicine

## 2021-11-27 DIAGNOSIS — R1011 Right upper quadrant pain: Secondary | ICD-10-CM

## 2021-11-29 ENCOUNTER — Other Ambulatory Visit: Payer: Self-pay | Admitting: Family Medicine

## 2021-11-29 DIAGNOSIS — R1011 Right upper quadrant pain: Secondary | ICD-10-CM

## 2022-07-18 ENCOUNTER — Other Ambulatory Visit: Payer: Self-pay | Admitting: Urology

## 2022-07-18 ENCOUNTER — Encounter (HOSPITAL_BASED_OUTPATIENT_CLINIC_OR_DEPARTMENT_OTHER): Payer: Self-pay | Admitting: Urology

## 2022-07-18 NOTE — Progress Notes (Signed)
07/18/2022 10:53 AM Pre procedure call completed. Pt. Husband Updated on date 07/22/22 and time of arrival 0800. Times for NPO and clear liquid consumption stopping at Midnight reviewed. Pt. Allergies, medical hx and medication list reviewed. Medications ok to take day of surgery and those to hold prior to surgery reviewed. Ride secured for day of surgery Pt. Husband Margaret Johnston. Questions and concerns addressed by patient husband. Note: Pt. Has early onset Alzheimer's disease and instructions were given to patient husband. Please allow him in pre op while getting patient ready. Pt. Husband Verbalized understanding of all instructions.  Margaret Johnston, Blanchard Kelch

## 2022-07-22 ENCOUNTER — Encounter (HOSPITAL_BASED_OUTPATIENT_CLINIC_OR_DEPARTMENT_OTHER)
Admission: RE | Disposition: A | Discharge status: Home / Self Care | Payer: Self-pay | Source: Home / Self Care | Attending: Urology

## 2022-07-22 ENCOUNTER — Other Ambulatory Visit: Payer: Self-pay

## 2022-07-22 ENCOUNTER — Ambulatory Visit (HOSPITAL_BASED_OUTPATIENT_CLINIC_OR_DEPARTMENT_OTHER)
Admission: RE | Admit: 2022-07-22 | Discharge: 2022-07-22 | Disposition: A | Discharge status: Home / Self Care | Payer: Medicare Other | Attending: Urology | Admitting: Urology

## 2022-07-22 ENCOUNTER — Ambulatory Visit (HOSPITAL_COMMUNITY): Payer: Medicare Other

## 2022-07-22 ENCOUNTER — Encounter (HOSPITAL_BASED_OUTPATIENT_CLINIC_OR_DEPARTMENT_OTHER): Payer: Self-pay | Admitting: Urology

## 2022-07-22 DIAGNOSIS — N189 Chronic kidney disease, unspecified: Secondary | ICD-10-CM | POA: Diagnosis not present

## 2022-07-22 DIAGNOSIS — F0390 Unspecified dementia without behavioral disturbance: Secondary | ICD-10-CM | POA: Insufficient documentation

## 2022-07-22 DIAGNOSIS — N201 Calculus of ureter: Secondary | ICD-10-CM | POA: Insufficient documentation

## 2022-07-22 HISTORY — PX: EXTRACORPOREAL SHOCK WAVE LITHOTRIPSY: SHX1557

## 2022-07-22 SURGERY — LITHOTRIPSY, ESWL
Anesthesia: LOCAL | Laterality: Left

## 2022-07-22 MED ORDER — DIPHENHYDRAMINE HCL 25 MG PO CAPS
ORAL_CAPSULE | ORAL | Status: AC
  Filled 2022-07-22: qty 1

## 2022-07-22 MED ORDER — CIPROFLOXACIN HCL 500 MG PO TABS
ORAL_TABLET | ORAL | Status: AC
  Filled 2022-07-22: qty 1

## 2022-07-22 MED ORDER — OXYCODONE-ACETAMINOPHEN 5-325 MG PO TABS: 1.0000 | ORAL_TABLET | Freq: Four times a day (QID) | ORAL | 0 refills | Status: AC | PRN

## 2022-07-22 MED ORDER — DIPHENHYDRAMINE HCL 25 MG PO CAPS: 25.0000 mg | ORAL_CAPSULE | ORAL | Status: DC

## 2022-07-22 MED ORDER — TAMSULOSIN HCL 0.4 MG PO CAPS: 0.4000 mg | ORAL_CAPSULE | Freq: Every day | ORAL | 0 refills | Status: AC

## 2022-07-22 MED ORDER — CIPROFLOXACIN HCL 500 MG PO TABS
500.0000 mg | ORAL_TABLET | ORAL | Status: AC
  Administered 2022-07-22: 500 mg via ORAL

## 2022-07-22 MED ORDER — DIAZEPAM 5 MG PO TABS
ORAL_TABLET | ORAL | Status: AC
  Filled 2022-07-22: qty 2

## 2022-07-22 MED ORDER — SODIUM CHLORIDE 0.9 % IV SOLN: INTRAVENOUS | Status: DC

## 2022-07-22 MED ORDER — DIAZEPAM 5 MG PO TABS: 10.0000 mg | ORAL_TABLET | ORAL | Status: DC

## 2022-07-22 NOTE — Discharge Instructions (Signed)
1 - You may have urinary urgency (bladder spasms), pass small stone fragments, and bloody urine on / off for up to 2 weeks. This is normal. ° °2 - Call MD or go to ER for fever >102, severe pain / nausea / vomiting not relieved by medications, or acute change in medical status ° °

## 2022-07-22 NOTE — H&P (Signed)
Margaret Johnston is an 65 y.o. female.    Chief Complaint: Pre-Op LEFT Shockwave Lithotripsy  HPI:   1 - LEFT Ureteral Stone - about 108mm left prox stone by CT 06/2022 (solitary at L2 area) , F/U KUB 07/2022 with some liekly antegrade progression neat L3, but additional non-stone calcifications nearby. Also calcified fibroid and phleboliths left pelvis. UCX and UA without infectious parameters.   Toay "Margaret Johnston" is seen to proceed with LEFT shockwave lithotripsy. NO interval fevers.   Past Medical History:  Diagnosis Date   Alzheimer's disease with early onset (HCC)    Chronic kidney disease    right ureteral stone   Depression    GERD (gastroesophageal reflux disease)    rarely takes Prilosec   History of kidney stones    Hypercholesterolemia    Hypothyroidism    Renal stones     Past Surgical History:  Procedure Laterality Date   CATARACT EXTRACTION     CERVICAL POLYPECTOMY     EXTRACORPOREAL SHOCK WAVE LITHOTRIPSY Right 11/10/2017   Procedure: RIGHT EXTRACORPOREAL SHOCK WAVE LITHOTRIPSY (ESWL);  Surgeon: Jerilee Field, MD;  Location: [facility];  Service: Urology;  Laterality: Right;   LITHOTRIPSY      Family History  Problem Relation Age of Onset   Mental illness Mother    Diabetes Mother    Heart disease Father        Living   Kidney disease Father    Cancer Maternal Grandmother    Alzheimer's disease Paternal Grandmother    Multiple sclerosis Sister    Headache Sister    Healthy Daughter    Hypothyroidism Daughter    Social History:  reports that she has never smoked. She has never used smokeless tobacco. She reports current alcohol use. She reports that she does not use drugs.  Allergies:  Allergies  Allergen Reactions   Hydrocodone-Acetaminophen Other (See Comments)    Other   Hydromorphone Itching   Oxycodone Itching   Hydrocodone-Acetaminophen Other (See Comments)    Other Other    No medications prior to admission.    No results found for this or any  previous visit (from the past 48 hour(s)). No results found.  Review of Systems  Constitutional:  Negative for chills and fever.  Genitourinary:  Positive for flank pain.  All other systems reviewed and are negative.   There were no vitals taken for this visit. Physical Exam Constitutional:      Comments: Significant dementia. Family helps with all decisions.       Assessment/Plan  Proceed as planned with LEFT shockwave lithotripsy. Risks, benefits, alternatives, expected peri-treatment course discussed. I reiterated that she may have difficulty targeting stone and that we would treat only if stone in question convincingly identified.  Proceed Sebastian Ache, MD 07/22/2022, 6:32 AM

## 2022-07-22 NOTE — Brief Op Note (Signed)
07/22/2022  10:09 AM  PATIENT:  Margaret Johnston  66 y.o. female  PRE-OPERATIVE DIAGNOSIS:  URETERA; CALCULI  POST-OPERATIVE DIAGNOSIS:  * No post-op diagnosis entered *  PROCEDURE:  Procedure(s) with comments: EXTRACORPOREAL SHOCK WAVE LITHOTRIPSY (ESWL) (Left) - 75 MINS  SURGEON:  Surgeon(s) and Role:    * Alexis Frock, MD - Primary  PHYSICIAN ASSISTANT:   ASSISTANTS: none   ANESTHESIA:   MAC  EBL:  minimal   BLOOD ADMINISTERED:none  DRAINS: none   LOCAL MEDICATIONS USED:  NONE  SPECIMEN:  No Specimen  DISPOSITION OF SPECIMEN:  N/A  COUNTS:  YES  TOURNIQUET:  * No tourniquets in log *  DICTATION: .Note written in paper chart  PLAN OF CARE: Discharge to home after PACU  PATIENT DISPOSITION:  Short Stay   Delay start of Pharmacological VTE agent (>24hrs) due to surgical blood loss or risk of bleeding: not applicable

## 2022-07-23 ENCOUNTER — Encounter (HOSPITAL_BASED_OUTPATIENT_CLINIC_OR_DEPARTMENT_OTHER): Payer: Self-pay | Admitting: Urology
